# Patient Record
Sex: Female | Born: 1960 | Race: Black or African American | Hispanic: No | Marital: Married | Smoking: Former smoker
Health system: Southern US, Community
[De-identification: ages and names within clinical notes are randomized; demographics above are authoritative.]

## PROBLEM LIST (undated history)

## (undated) DIAGNOSIS — I10 Essential (primary) hypertension: Secondary | ICD-10-CM

## (undated) DIAGNOSIS — N63 Unspecified lump in unspecified breast: Secondary | ICD-10-CM

## (undated) DIAGNOSIS — N6009 Solitary cyst of unspecified breast: Secondary | ICD-10-CM

## (undated) DIAGNOSIS — Z87891 Personal history of nicotine dependence: Secondary | ICD-10-CM

## (undated) DIAGNOSIS — Z1211 Encounter for screening for malignant neoplasm of colon: Secondary | ICD-10-CM

## (undated) DIAGNOSIS — Z1239 Encounter for other screening for malignant neoplasm of breast: Secondary | ICD-10-CM

## (undated) HISTORY — DX: Encounter for other screening for malignant neoplasm of breast: Z12.39

## (undated) HISTORY — PX: BREAST BIOPSY: SHX20

## (undated) HISTORY — DX: Encounter for screening for malignant neoplasm of colon: Z12.11

## (undated) HISTORY — DX: Solitary cyst of unspecified breast: N60.09

## (undated) HISTORY — DX: Personal history of nicotine dependence: Z87.891

## (undated) HISTORY — DX: Essential (primary) hypertension: I10

## (undated) HISTORY — DX: Unspecified lump in unspecified breast: N63.0

## (undated) HISTORY — PX: BREAST SURGERY: SHX581

---

## 2002-07-16 HISTORY — PX: ABDOMINAL HYSTERECTOMY: SHX81

## 2004-06-29 ENCOUNTER — Ambulatory Visit: Payer: Self-pay | Admitting: Family Medicine

## 2005-11-28 ENCOUNTER — Ambulatory Visit: Payer: Self-pay | Admitting: Family Medicine

## 2006-07-16 HISTORY — PX: BREAST SURGERY: SHX581

## 2007-03-21 ENCOUNTER — Ambulatory Visit: Payer: Self-pay | Admitting: Family Medicine

## 2007-03-24 ENCOUNTER — Ambulatory Visit: Payer: Self-pay | Admitting: Family Medicine

## 2007-04-17 ENCOUNTER — Ambulatory Visit: Payer: Self-pay | Admitting: General Surgery

## 2007-04-24 ENCOUNTER — Ambulatory Visit: Payer: Self-pay | Admitting: General Surgery

## 2008-05-06 ENCOUNTER — Ambulatory Visit: Payer: Self-pay | Admitting: Gastroenterology

## 2008-06-16 ENCOUNTER — Ambulatory Visit: Payer: Self-pay | Admitting: Family Medicine

## 2008-06-29 ENCOUNTER — Ambulatory Visit: Payer: Self-pay | Admitting: Family Medicine

## 2008-07-16 DIAGNOSIS — I10 Essential (primary) hypertension: Secondary | ICD-10-CM

## 2008-07-16 HISTORY — DX: Essential (primary) hypertension: I10

## 2008-07-16 HISTORY — PX: COLONOSCOPY: SHX174

## 2009-01-04 ENCOUNTER — Ambulatory Visit: Payer: Self-pay | Admitting: General Surgery

## 2009-09-13 LAB — HM PAP SMEAR: HM Pap smear: NORMAL

## 2009-09-13 LAB — HM DEXA SCAN: HM DEXA SCAN: NORMAL

## 2010-04-25 ENCOUNTER — Ambulatory Visit: Payer: Self-pay | Admitting: Family Medicine

## 2011-05-24 ENCOUNTER — Ambulatory Visit: Payer: Self-pay | Admitting: Family Medicine

## 2011-07-17 DIAGNOSIS — Z87891 Personal history of nicotine dependence: Secondary | ICD-10-CM

## 2011-07-17 HISTORY — DX: Personal history of nicotine dependence: Z87.891

## 2012-05-30 ENCOUNTER — Ambulatory Visit: Payer: Self-pay | Admitting: Family Medicine

## 2012-06-03 ENCOUNTER — Ambulatory Visit: Payer: Self-pay | Admitting: Family Medicine

## 2012-06-11 DIAGNOSIS — N6009 Solitary cyst of unspecified breast: Secondary | ICD-10-CM

## 2012-06-11 HISTORY — DX: Solitary cyst of unspecified breast: N60.09

## 2012-09-27 ENCOUNTER — Encounter: Payer: Self-pay | Admitting: General Surgery

## 2012-10-27 ENCOUNTER — Encounter: Payer: Self-pay | Admitting: *Deleted

## 2012-10-27 DIAGNOSIS — N6009 Solitary cyst of unspecified breast: Secondary | ICD-10-CM | POA: Insufficient documentation

## 2012-10-27 DIAGNOSIS — N63 Unspecified lump in unspecified breast: Secondary | ICD-10-CM | POA: Insufficient documentation

## 2012-12-09 ENCOUNTER — Ambulatory Visit: Payer: PRIVATE HEALTH INSURANCE | Admitting: General Surgery

## 2013-01-15 ENCOUNTER — Encounter: Payer: Self-pay | Admitting: *Deleted

## 2013-01-22 ENCOUNTER — Ambulatory Visit: Payer: Self-pay | Admitting: General Surgery

## 2013-02-11 ENCOUNTER — Ambulatory Visit: Payer: Self-pay | Admitting: General Surgery

## 2013-03-17 ENCOUNTER — Encounter: Payer: Self-pay | Admitting: *Deleted

## 2013-04-24 ENCOUNTER — Encounter: Payer: Self-pay | Admitting: General Surgery

## 2013-05-14 ENCOUNTER — Encounter: Payer: Self-pay | Admitting: General Surgery

## 2013-05-14 ENCOUNTER — Ambulatory Visit (INDEPENDENT_AMBULATORY_CARE_PROVIDER_SITE_OTHER): Payer: PRIVATE HEALTH INSURANCE | Admitting: General Surgery

## 2013-05-14 ENCOUNTER — Other Ambulatory Visit: Payer: PRIVATE HEALTH INSURANCE

## 2013-05-14 VITALS — BP 124/78 | HR 80 | Resp 14 | Ht 66.0 in | Wt 182.0 lb

## 2013-05-14 DIAGNOSIS — N63 Unspecified lump in unspecified breast: Secondary | ICD-10-CM

## 2013-05-14 NOTE — Progress Notes (Signed)
Patient ID: Latoya Jackson, female   DOB: 28-May-1961, 52 y.o.   MRN: [id number]  Chief Complaint  Patient presents with  . Follow-up    breast ultrasound    HPI Latoya Jackson is a 52 y.o. female.  Here for her follow up office breast ultrasound. Denies any new breast complaints.   The patient had undergone aspiration of multiple small cysts in the inferior medial aspect of the left breast last year. HPI  Past Medical History  Diagnosis Date  . Hypertension 2010  . Personal history of tobacco use, presenting hazards to health 2013  . Breast screening, unspecified   . Lump or mass in breast 2008,2009,2103  . Special screening for malignant neoplasms, colon   . Solitary cyst of breast 06/11/2012    left breast 8 o'clock-path, cystic lesionwith small adjacent nodular areas that could be consistent with fat necrosis    Past Surgical History  Procedure Laterality Date  . Abdominal hysterectomy  2004  . Breast surgery Right 2008  . Breast surgery Left 2009,2013  . Colonoscopy  2010    No family history on file.  Social History History  Substance Use Topics  . Smoking status: Current Every Day Smoker -- 0.25 packs/day for 10 years    Types: Cigarettes  . Smokeless tobacco: Not on file     Comment: smokes about 6 cigarettes a day  . Alcohol Use: Yes     Comment: 1-3 drinks per week    No Known Allergies  Current Outpatient Prescriptions  Medication Sig Dispense Refill  . aspirin 81 MG tablet Take 81 mg by mouth daily.      Marland Kitchen lisinopril-hydrochlorothiazide (PRINZIDE,ZESTORETIC) 20-12.5 MG per tablet Take 1 tablet by mouth daily.      Marland Kitchen loratadine (CLARITIN) 10 MG tablet Take 10 mg by mouth daily.        No current facility-administered medications for this visit.    Review of Systems Review of Systems  Constitutional: Negative.   Respiratory: Negative.   Cardiovascular: Negative.     Blood pressure 124/78, pulse 80, resp. rate 14, height 5\' 6"  (1.676 m), weight  182 lb (82.555 kg).  Physical Exam Physical Exam  Constitutional: She is oriented to person, place, and time. She appears well-developed and well-nourished.  Neck: Neck supple.  Cardiovascular: Normal rate, regular rhythm and normal heart sounds.   Pulmonary/Chest: Effort normal and breath sounds normal. Right breast exhibits no inverted nipple, no mass, no nipple discharge, no skin change and no tenderness. Left breast exhibits no inverted nipple, no mass, no nipple discharge, no skin change and no tenderness.  Lymphadenopathy:    She has no cervical adenopathy.    She has no axillary adenopathy.  Neurological: She is alert and oriented to person, place, and time.  Skin: Skin is warm and dry.    Data Reviewed Ultrasound examination of the left breast and it o'clock position 3 cm from the nipple shows a small recurrent hypoechoic nodule measuring less than 0.4 cm in maximum diameter. Previously this multiloculated area had measured up to 0.5 cm in diameter. It had resolved at the time of aspiration at her 06/09/2012 exam. Cytology at that time was listed as inconclusive: Atypical but degenerated cells with apical metaplasia and foam cells. Suggestive of fat necrosis. The cytology report was reviewed with the pathologist.  Assessment    Stable breast exam.    Plan    The patient is due for bilateral mammograms next month. Assuming no interval change,  follow up-year-old be on an as-needed basis.       Earline Mayotte 05/16/2013, 12:10 PM

## 2013-05-14 NOTE — Patient Instructions (Addendum)
Continue self breast exams. Call office for any new breast issues or concerns. Continue mammograms and office visits with primary care Dr Carlynn Purl

## 2013-05-16 ENCOUNTER — Encounter: Payer: Self-pay | Admitting: General Surgery

## 2013-05-18 ENCOUNTER — Telehealth: Payer: Self-pay | Admitting: *Deleted

## 2013-05-18 DIAGNOSIS — Z1231 Encounter for screening mammogram for malignant neoplasm of breast: Secondary | ICD-10-CM

## 2013-05-18 NOTE — Telephone Encounter (Signed)
Message copied by Nicholes Mango on Mon May 18, 2013  2:58 PM ------      Message from: Earline Mayotte      Created: Sat May 16, 2013 12:21 PM       Please confirm the patient is a mammogram scheduled for this months. Last study 05/30/2012. If not please arrange for a screening mammogram. If she has her mammograms already ordered by Dr. Sallee Lange, ask her to notify us when they're done so the films can be reviewed. Thank you ------

## 2013-05-18 NOTE — Telephone Encounter (Signed)
Patient was contacted and states that Dr. Carlynn Purl has not ordered her mammogram yet and she is not due to follow up with her until January 2015. This patient has been scheduled for a screening mammogram at [facility] for 06-04-13 at 4 pm ([place]-per patient's request). She is aware of date, time, and instructions. Patient to call the office the day after mammogram for results (she is aware that Dr. Lemar Livings will be on vacation the last week in November and he may not review until his return).

## 2013-05-22 ENCOUNTER — Encounter: Payer: Self-pay | Admitting: General Surgery

## 2013-05-25 NOTE — Telephone Encounter (Signed)
Mammo 06-04-13

## 2013-06-04 ENCOUNTER — Ambulatory Visit: Payer: Self-pay | Admitting: General Surgery

## 2013-06-04 LAB — HM MAMMOGRAPHY: HM Mammogram: NORMAL

## 2013-11-18 LAB — LIPID PANEL
CHOLESTEROL: 220 mg/dL — AB (ref 0–200)
HDL: 161 mg/dL — AB (ref 35–70)
LDL Cholesterol: 143 mg/dL
Triglycerides: 79 mg/dL (ref 40–160)

## 2013-11-18 LAB — HEMOGLOBIN A1C: Hgb A1c MFr Bld: 5.9 % (ref 4.0–6.0)

## 2014-02-15 ENCOUNTER — Ambulatory Visit: Payer: Self-pay | Admitting: Gastroenterology

## 2014-02-15 LAB — HM COLONOSCOPY

## 2014-05-17 ENCOUNTER — Encounter: Payer: Self-pay | Admitting: General Surgery

## 2014-06-29 ENCOUNTER — Ambulatory Visit: Payer: Self-pay | Admitting: Family Medicine

## 2014-12-31 ENCOUNTER — Ambulatory Visit: Payer: Self-pay | Admitting: Family Medicine

## 2015-01-11 ENCOUNTER — Ambulatory Visit: Payer: Self-pay | Admitting: Family Medicine

## 2015-01-19 ENCOUNTER — Encounter: Payer: Self-pay | Admitting: Family Medicine

## 2015-01-19 DIAGNOSIS — J3089 Other allergic rhinitis: Secondary | ICD-10-CM | POA: Insufficient documentation

## 2015-01-19 DIAGNOSIS — K219 Gastro-esophageal reflux disease without esophagitis: Secondary | ICD-10-CM | POA: Insufficient documentation

## 2015-01-19 DIAGNOSIS — R7401 Elevation of levels of liver transaminase levels: Secondary | ICD-10-CM | POA: Insufficient documentation

## 2015-01-19 DIAGNOSIS — F4321 Adjustment disorder with depressed mood: Secondary | ICD-10-CM | POA: Insufficient documentation

## 2015-01-19 DIAGNOSIS — L509 Urticaria, unspecified: Secondary | ICD-10-CM | POA: Insufficient documentation

## 2015-01-19 DIAGNOSIS — R739 Hyperglycemia, unspecified: Secondary | ICD-10-CM | POA: Insufficient documentation

## 2015-01-19 DIAGNOSIS — Z8619 Personal history of other infectious and parasitic diseases: Secondary | ICD-10-CM | POA: Insufficient documentation

## 2015-01-19 DIAGNOSIS — E785 Hyperlipidemia, unspecified: Secondary | ICD-10-CM | POA: Insufficient documentation

## 2015-01-19 DIAGNOSIS — Z720 Tobacco use: Secondary | ICD-10-CM | POA: Insufficient documentation

## 2015-01-19 DIAGNOSIS — R740 Nonspecific elevation of levels of transaminase and lactic acid dehydrogenase [LDH]: Secondary | ICD-10-CM

## 2015-01-19 DIAGNOSIS — E538 Deficiency of other specified B group vitamins: Secondary | ICD-10-CM | POA: Insufficient documentation

## 2015-01-19 DIAGNOSIS — E559 Vitamin D deficiency, unspecified: Secondary | ICD-10-CM | POA: Insufficient documentation

## 2015-01-19 DIAGNOSIS — L821 Other seborrheic keratosis: Secondary | ICD-10-CM | POA: Insufficient documentation

## 2015-01-19 DIAGNOSIS — D573 Sickle-cell trait: Secondary | ICD-10-CM | POA: Insufficient documentation

## 2015-01-19 DIAGNOSIS — I10 Essential (primary) hypertension: Secondary | ICD-10-CM | POA: Insufficient documentation

## 2015-01-20 ENCOUNTER — Ambulatory Visit: Payer: Self-pay | Admitting: Family Medicine

## 2015-01-23 ENCOUNTER — Other Ambulatory Visit: Payer: Self-pay | Admitting: Family Medicine

## 2015-02-02 ENCOUNTER — Ambulatory Visit (INDEPENDENT_AMBULATORY_CARE_PROVIDER_SITE_OTHER): Payer: BLUE CROSS/BLUE SHIELD | Admitting: Family Medicine

## 2015-02-02 ENCOUNTER — Encounter: Payer: Self-pay | Admitting: Family Medicine

## 2015-02-02 ENCOUNTER — Encounter (INDEPENDENT_AMBULATORY_CARE_PROVIDER_SITE_OTHER): Payer: Self-pay

## 2015-02-02 VITALS — BP 136/84 | HR 80 | Temp 98.3°F | Resp 18 | Ht 66.0 in | Wt 191.9 lb

## 2015-02-02 DIAGNOSIS — E559 Vitamin D deficiency, unspecified: Secondary | ICD-10-CM

## 2015-02-02 DIAGNOSIS — E66811 Obesity, class 1: Secondary | ICD-10-CM | POA: Insufficient documentation

## 2015-02-02 DIAGNOSIS — E785 Hyperlipidemia, unspecified: Secondary | ICD-10-CM

## 2015-02-02 DIAGNOSIS — E538 Deficiency of other specified B group vitamins: Secondary | ICD-10-CM | POA: Diagnosis not present

## 2015-02-02 DIAGNOSIS — I10 Essential (primary) hypertension: Secondary | ICD-10-CM

## 2015-02-02 DIAGNOSIS — K219 Gastro-esophageal reflux disease without esophagitis: Secondary | ICD-10-CM

## 2015-02-02 DIAGNOSIS — R739 Hyperglycemia, unspecified: Secondary | ICD-10-CM

## 2015-02-02 DIAGNOSIS — J309 Allergic rhinitis, unspecified: Secondary | ICD-10-CM | POA: Diagnosis not present

## 2015-02-02 DIAGNOSIS — J3089 Other allergic rhinitis: Secondary | ICD-10-CM

## 2015-02-02 DIAGNOSIS — E669 Obesity, unspecified: Secondary | ICD-10-CM | POA: Diagnosis not present

## 2015-02-02 DIAGNOSIS — R5383 Other fatigue: Secondary | ICD-10-CM

## 2015-02-02 MED ORDER — LISINOPRIL-HYDROCHLOROTHIAZIDE 20-12.5 MG PO TABS: 1.0000 | ORAL_TABLET | Freq: Every morning | ORAL | Status: DC

## 2015-02-02 MED ORDER — LORATADINE 10 MG PO TABS: 10.0000 mg | ORAL_TABLET | Freq: Every day | ORAL | Status: DC

## 2015-02-02 MED ORDER — B-12 1000 MCG SL SUBL: 1.0000 | SUBLINGUAL_TABLET | Freq: Every day | SUBLINGUAL | Status: AC

## 2015-02-02 NOTE — Patient Instructions (Signed)

## 2015-02-02 NOTE — Progress Notes (Signed)
Name: Latoya Jackson   MRN: [id number]    DOB: [birth year]   Date:02/02/2015       Progress Note  Subjective  Chief Complaint  Chief Complaint  Patient presents with  . Medication Refill    6 month F/U  . Gastrophageal Reflux    worsen-trying to watch what she eats, been eating candy and chocolate.  . Hypertension    Checks at home BP- 120/80's  . Hyperlipidemia    no problems    HPI  HTN: she has not taken bp medication past couple days, bp is up today, she denies side effects of medication. She denies chest pain or SOB  GERD: she has not been following a healthy diet lately, gained weight , eating later than usual. Symptoms of a few days weekly , resolves with Tums.    Hyperlipidemia: not currently on medication, due for repeat labs  Obesity: she has gained weight since last visit, not as compliant with a healthy diet, granddaughter has been at her house for the past 2 months and she has been eating later, around 8:30 pm, and having snacks.  Not exercise at all.  Fatigue: feeling more tired than usual lately  Perennial AR: doing well on Loratadine daily, no sneezing, nasal congestion or watery eyes at this time   Patient Active Problem List   Diagnosis Date Noted  . Obesity (BMI 30.0-34.9) 02/02/2015  . Benign essential HTN 01/19/2015  . Dyslipidemia 01/19/2015  . 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 ALT (SGPT) level raised 01/19/2015  . Gastro-esophageal reflux disease without esophagitis 01/19/2015  . Hive 01/19/2015  . Blood glucose elevated 01/19/2015  . B12 deficiency 01/19/2015  . Perennial allergic rhinitis 01/19/2015  . History of Helicobacter pylori infection 01/19/2015  . 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 AS (sickle cell trait) 01/19/2015  . Tobacco use 01/19/2015  . Vitamin D deficiency 01/19/2015  . Solitary cyst of breast     Past Surgical History  Procedure Laterality Date  . Abdominal hysterectomy  2004  . Colonoscopy  2010  . Breast surgery  Right 2008    Intraductal papilloma excised  . Breast surgery Left 2009,2013    Benign breast tissue with dense fibrosis., Focal adenosis with a few small intraductal microcalcifications.    History reviewed. No pertinent family history.  History   Social History  . Marital Status: Married    Spouse Name: N/A  . Number of Children: N/A  . Years of Education: N/A   Occupational History  . Not on file.   Social History Main Topics  . Smoking status: Current Every Day Smoker -- 0.15 packs/day for 10 years    Types: Cigarettes    Start date: 02/01/2005  . Smokeless tobacco: Never Used     Comment: smokes about 6 cigarettes a day  . Alcohol Use: 0.6 - 1.8 oz/week    1-3 Standard drinks or equivalent per week     Comment: 1-3 drinks per week  . Drug Use: No  . Sexual Activity:    Partners: Male   Other Topics Concern  . Not on file   Social History Narrative     Current outpatient prescriptions:  .  aspirin 81 MG tablet, Take 81 mg by mouth daily., Disp: , Rfl:  .  Cholecalciferol (VITAMIN D) 2000 UNITS CAPS, Take 1 capsule by mouth daily., Disp: , Rfl:  .  Cyanocobalamin (B-12) 1000 MCG SUBL, Place 1 tablet under the tongue daily., Disp: 90 each, Rfl: 1 .  lisinopril-hydrochlorothiazide (PRINZIDE,ZESTORETIC) 20-12.5 MG per tablet, Take 1 tablet by mouth every morning., Disp: 90 tablet, Rfl: 1 .  loratadine (CLARITIN) 10 MG tablet, Take 1 tablet (10 mg total) by mouth daily., Disp: 90 tablet, Rfl: 1  Allergies  Allergen Reactions  . Pollen Extract   . Tree Extract      ROS  Constitutional: Negative for fever positive for  weight change.  Respiratory: Negative for cough and shortness of breath.   Cardiovascular: Negative for chest pain or palpitations.  Gastrointestinal: Negative for abdominal pain, no bowel changes.  Musculoskeletal: Negative for gait problem or joint swelling.  Skin: Negative for rash.  Neurological: Negative for dizziness or headache.  No  other specific complaints in a complete review of systems (except as listed in HPI above).  Objective  Filed Vitals:   02/02/15 0820  BP: 144/88  Pulse: 80  Temp: 98.3 F (36.8 C)  TempSrc: Oral  Resp: 18  Height: 5\' 6"  (1.676 m)  Weight: 191 lb 14.4 oz (87.045 kg)  SpO2: 98%    Body mass index is 30.99 kg/(m^2).  Physical Exam  Constitutional: Patient appears well-developed and well-nourished. Obese No distress.  Eyes:  No scleral icterus. PERL Neck: Normal range of motion. Neck supple. Cardiovascular: Normal rate, regular rhythm and normal heart sounds.  No murmur heard. No BLE edema. Pulmonary/Chest: Effort normal and breath sounds normal. No respiratory distress. Abdominal: Soft.  There is no tenderness. Psychiatric: Patient has a normal mood and affect. behavior is normal. Judgment and thought content normal.  PHQ2/9: Depression screen PHQ 2/9 02/02/2015  Decreased Interest 0  Down, Depressed, Hopeless 0  PHQ - 2 Score 0    Fall Risk: Fall Risk  02/02/2015  Falls in the past year? No      Assessment & Plan  1. Benign essential HTN  Resume medication  - Comprehensive Metabolic Panel (CMET) - lisinopril-hydrochlorothiazide (PRINZIDE,ZESTORETIC) 20-12.5 MG per tablet; Take 1 tablet by mouth every morning.  Dispense: 90 tablet; Refill: 1  2. Vitamin D deficiency  - Vitamin D (25 hydroxy)  3. Blood glucose elevated  - HgB A1c  4. B12 deficiency  - Cyanocobalamin (B-12) 1000 MCG SUBL; Place 1 tablet under the tongue daily.  Dispense: 90 each; Refill: 1  5. Gastro-esophageal reflux disease without esophagitis  Resume life style modification , and can take Zantac or Tums prn   6. Dyslipidemia  - Lipid Profile  7. Other fatigue  - CBC with Differential  8. Perennial allergic rhinitis  - loratadine (CLARITIN) 10 MG tablet; Take 1 tablet (10 mg total) by mouth daily.  Dispense: 90 tablet; Refill: 1  9. Obesity (BMI 30.0-34.9) Discussed portion  size, eat earlier in the evening. Pack a lighter lunch, and start exercising.

## 2015-02-03 ENCOUNTER — Other Ambulatory Visit: Payer: Self-pay | Admitting: Family Medicine

## 2015-02-03 LAB — COMPREHENSIVE METABOLIC PANEL
A/G RATIO: 1.7 (ref 1.1–2.5)
ALBUMIN: 4.3 g/dL (ref 3.5–5.5)
ALK PHOS: 56 IU/L (ref 39–117)
ALT: 29 IU/L (ref 0–32)
AST: 24 IU/L (ref 0–40)
BUN/Creatinine Ratio: 18 (ref 9–23)
BUN: 13 mg/dL (ref 6–24)
Bilirubin Total: <0.2 mg/dL (ref 0.0–1.2)
CALCIUM: 9.8 mg/dL (ref 8.7–10.2)
CHLORIDE: 102 mmol/L (ref 97–108)
CO2: 23 mmol/L (ref 18–29)
Creatinine, Ser: 0.73 mg/dL (ref 0.57–1.00)
GFR calc Af Amer: 106 mL/min/{1.73_m2} (ref >59)
GFR calc non Af Amer: 92 mL/min/{1.73_m2} (ref >59)
GLOBULIN, TOTAL: 2.6 g/dL (ref 1.5–4.5)
Glucose: 95 mg/dL (ref 65–99)
POTASSIUM: 4.7 mmol/L (ref 3.5–5.2)
SODIUM: 141 mmol/L (ref 134–144)
Total Protein: 6.9 g/dL (ref 6.0–8.5)

## 2015-02-03 LAB — CBC WITH DIFFERENTIAL/PLATELET
BASOS ABS: 0 10*3/uL (ref 0.0–0.2)
Basos: 0 %
EOS (ABSOLUTE): 0.2 10*3/uL (ref 0.0–0.4)
EOS: 2 %
Hematocrit: 36.7 % (ref 34.0–46.6)
Hemoglobin: 12.4 g/dL (ref 11.1–15.9)
Immature Grans (Abs): 0 10*3/uL (ref 0.0–0.1)
Immature Granulocytes: 0 %
LYMPHS: 32 %
Lymphocytes Absolute: 2.2 10*3/uL (ref 0.7–3.1)
MCH: 31.2 pg (ref 26.6–33.0)
MCHC: 33.8 g/dL (ref 31.5–35.7)
MCV: 92 fL (ref 79–97)
Monocytes Absolute: 0.6 10*3/uL (ref 0.1–0.9)
Monocytes: 9 %
NEUTROS ABS: 3.9 10*3/uL (ref 1.4–7.0)
NEUTROS PCT: 57 %
Platelets: 348 10*3/uL (ref 150–379)
RBC: 3.98 x10E6/uL (ref 3.77–5.28)
RDW: 13.5 % (ref 12.3–15.4)
WBC: 6.8 10*3/uL (ref 3.4–10.8)

## 2015-02-03 LAB — LIPID PANEL
CHOL/HDL RATIO: 4 ratio (ref 0.0–4.4)
CHOLESTEROL TOTAL: 223 mg/dL — AB (ref 100–199)
HDL: 56 mg/dL (ref >39)
LDL Calculated: 149 mg/dL — ABNORMAL HIGH (ref 0–99)
Triglycerides: 89 mg/dL (ref 0–149)
VLDL Cholesterol Cal: 18 mg/dL (ref 5–40)

## 2015-02-03 LAB — VITAMIN D 25 HYDROXY (VIT D DEFICIENCY, FRACTURES): Vit D, 25-Hydroxy: 42.8 ng/mL (ref 30.0–100.0)

## 2015-02-03 LAB — HEMOGLOBIN A1C
Est. average glucose Bld gHb Est-mCnc: 123 mg/dL
Hgb A1c MFr Bld: 5.9 % — ABNORMAL HIGH (ref 4.8–5.6)

## 2015-02-03 MED ORDER — ATORVASTATIN CALCIUM 40 MG PO TABS: 40.0000 mg | ORAL_TABLET | Freq: Every day | ORAL | Status: DC

## 2015-05-05 ENCOUNTER — Encounter: Payer: Self-pay | Admitting: Family Medicine

## 2015-05-05 ENCOUNTER — Ambulatory Visit (INDEPENDENT_AMBULATORY_CARE_PROVIDER_SITE_OTHER): Payer: BLUE CROSS/BLUE SHIELD | Admitting: Family Medicine

## 2015-05-05 VITALS — BP 122/66 | HR 93 | Temp 98.1°F | Resp 16 | Ht 66.0 in | Wt 187.1 lb

## 2015-05-05 DIAGNOSIS — I10 Essential (primary) hypertension: Secondary | ICD-10-CM

## 2015-05-05 DIAGNOSIS — M62838 Other muscle spasm: Secondary | ICD-10-CM

## 2015-05-05 DIAGNOSIS — Z23 Encounter for immunization: Secondary | ICD-10-CM | POA: Diagnosis not present

## 2015-05-05 DIAGNOSIS — E785 Hyperlipidemia, unspecified: Secondary | ICD-10-CM | POA: Diagnosis not present

## 2015-05-05 LAB — POCT URINALYSIS DIPSTICK
Bilirubin, UA: NEGATIVE
Glucose, UA: NEGATIVE
Ketones, UA: NEGATIVE
LEUKOCYTES UA: NEGATIVE
NITRITE UA: NEGATIVE
PH UA: 5
PROTEIN UA: NEGATIVE
RBC UA: NEGATIVE
Spec Grav, UA: 1.015
UROBILINOGEN UA: 0.2

## 2015-05-05 MED ORDER — NAPROXEN 500 MG PO TABS: 500.0000 mg | ORAL_TABLET | Freq: Two times a day (BID) | ORAL | Status: DC

## 2015-05-05 MED ORDER — METAXALONE 800 MG PO TABS: 800.0000 mg | ORAL_TABLET | Freq: Three times a day (TID) | ORAL | Status: DC

## 2015-05-05 NOTE — Progress Notes (Signed)
Name: Latoya MartinezSabrina H Jackson   MRN: [id number]    DOB: Nov 18, 1960   Date:05/05/2015       Progress Note  Subjective  Chief Complaint  Chief Complaint  Patient presents with  . Back Pain    onset yesterday started on right side and now has moved to lower back.  Patient states worse when she stands.  Denies any trauma or burning or pain with urination.    HPI   Flank pain: she got up feeling well yesterday, but as she was walking out of her house, she developed acute pain on right flank area. Pain constant, but worse with movement, better when holding the are.  No fever, no urinary symptoms ( frequency, blood in urine, dysuria), no back pain, no radiation to lower leg or inner abdomen. Pain is described as a sharp sensation, at rest is 5/10, but gets to 10/10 with movement. No rashes on the area.   Hyperlipidemia: taking Atorvastatin since July. ASCVD : 16% in the next 10 years. No side effects. No chest pain, no myalgias.   HTN: taking medication, feeling well, bp is at goal, no edema, no SOB   Patient Active Problem List   Diagnosis Date Noted  . Obesity (BMI 30.0-34.9) 02/02/2015  . Benign essential HTN 01/19/2015  . Dyslipidemia 01/19/2015  . 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 Gastro-esophageal reflux disease without esophagitis 01/19/2015  . Hive 01/19/2015  . Blood glucose elevated 01/19/2015  . B12 deficiency 01/19/2015  . Perennial allergic rhinitis 01/19/2015  . History of Helicobacter pylori infection 01/19/2015  . 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 AS (sickle cell trait) (HCC) 01/19/2015  . Tobacco use 01/19/2015  . Vitamin D deficiency 01/19/2015  . Solitary cyst of breast     Past Surgical History  Procedure Laterality Date  . Abdominal hysterectomy  2004  . Colonoscopy  2010  . Breast surgery Right 2008    Intraductal papilloma excised  . Breast surgery Left 2009,2013    Benign breast tissue with dense fibrosis., Focal adenosis with a few small intraductal  microcalcifications.    No family history on file.  Social History   Social History  . Marital Status: Married    Spouse Name: N/A  . Number of Children: N/A  . Years of Education: N/A   Occupational History  . Not on file.   Social History Main Topics  . Smoking status: Current Every Day Smoker -- 0.15 packs/day for 10 years    Types: Cigarettes    Start date: 02/01/2005  . Smokeless tobacco: Never Used     Comment: smokes about 6 cigarettes a day  . Alcohol Use: 0.6 - 1.8 oz/week    1-3 Standard drinks or equivalent per week     Comment: 1-3 drinks per week  . Drug Use: No  . Sexual Activity:    Partners: Male   Other Topics Concern  . Not on file   Social History Narrative     Current outpatient prescriptions:  .  aspirin 81 MG tablet, Take 81 mg by mouth daily., Disp: , Rfl:  .  atorvastatin (LIPITOR) 40 MG tablet, Take 1 tablet (40 mg total) by mouth daily., Disp: 90 tablet, Rfl: 1 .  Cholecalciferol (VITAMIN D) 2000 UNITS CAPS, Take 1 capsule by mouth daily., Disp: , Rfl:  .  Cyanocobalamin (B-12) 1000 MCG SUBL, Place 1 tablet under the tongue daily., Disp: 90 each, Rfl: 1 .  lisinopril-hydrochlorothiazide (PRINZIDE,ZESTORETIC) 20-12.5 MG per tablet, Take 1 tablet  by mouth every morning., Disp: 90 tablet, Rfl: 1 .  loratadine (CLARITIN) 10 MG tablet, Take 1 tablet (10 mg total) by mouth daily., Disp: 90 tablet, Rfl: 1 .  metaxalone (SKELAXIN) 800 MG tablet, Take 1 tablet (800 mg total) by mouth 3 (three) times daily., Disp: 30 tablet, Rfl: 0 .  naproxen (NAPROSYN) 500 MG tablet, Take 1 tablet (500 mg total) by mouth 2 (two) times daily with a meal., Disp: 30 tablet, Rfl: 0  Allergies  Allergen Reactions  . Pollen Extract   . Tree Extract      ROS   Constitutional: Negative for fever or weight change.  Respiratory: Negative for cough and shortness of breath.   Cardiovascular: Negative for chest pain or palpitations.  Gastrointestinal: Positive  for  abdominal wall  pain, no bowel changes.  Musculoskeletal: Negative for gait problem or joint swelling.  Skin: Negative for rash.  Neurological: Negative for dizziness or headache.  No other specific complaints in a complete review of systems (except as listed in HPI above).  Objective  Filed Vitals:   05/05/15 1501  BP: 122/66  Pulse: 93  Temp: 98.1 F (36.7 C)  TempSrc: Oral  Resp: 16  Height:  (1.676 m)  Weight: 187 lb 1.6 oz (84.868 kg)  SpO2: 95%    Body mass index is 30.21 kg/(m^2).  Physical Exam  Constitutional: Patient appears well-developed and well-nourished. Obese  No distress.  HEENT: head atraumatic, normocephalic, pupils equal and reactive to light,  neck supple, throat within normal limits Cardiovascular: Normal rate, regular rhythm and normal heart sounds.  No murmur heard. No BLE edema. Pulmonary/Chest: Effort normal and breath sounds normal. No respiratory distress. Abdominal: Soft.  Negative CVA, tender during palpation of right flank, and also with rotation of trunk to the left side, no rashes, no abdominal masses, pain is reproducible Psychiatric: Patient has a normal mood and affect. behavior is normal. Judgment and thought content normal.  Recent Results (from the past 2160 hour(s))  POCT urinalysis dipstick     Status: Normal   Collection Time: 05/05/15  3:07 PM  Result Value Ref Range   Color, UA yellow    Clarity, UA clear    Glucose, UA neg    Bilirubin, UA neg    Ketones, UA neg    Spec Grav, UA 1.015    Blood, UA neg    pH, UA 5.0    Protein, UA neg    Urobilinogen, UA 0.2    Nitrite, UA neg    Leukocytes, UA Negative Negative     PHQ2/9: Depression screen [facility] 2/9 05/05/2015 02/02/2015  Decreased Interest 0 0  Down, Depressed, Hopeless 0 0  PHQ - 2 Score 0 0     Fall Risk: Fall Risk  05/05/2015 02/02/2015  Falls in the past year? No No     Functional Status Survey: Is the patient deaf or have difficulty hearing?:  No Does the patient have difficulty seeing, even when wearing glasses/contacts?: Yes (contacts) Does the patient have difficulty concentrating, remembering, or making decisions?: No Does the patient have difficulty walking or climbing stairs?: No Does the patient have difficulty dressing or bathing?: No Does the patient have difficulty doing errands alone such as visiting a doctor's office or shopping?: No    Assessment & Plan  1. Spasm of abdominal muscles of right side  Flank pain, seems muscular, we will treat with Skelaxin and Naproxen, heating pad and if no resolution return for trigger point  injection - POCT urinalysis dipstick - metaxalone (SKELAXIN) 800 MG tablet; Take 1 tablet (800 mg total) by mouth 3 (three) times daily.  Dispense: 30 tablet; Refill: 0 - naproxen (NAPROSYN) 500 MG tablet; Take 1 tablet (500 mg total) by mouth 2 (two) times daily with a meal.  Dispense: 30 tablet; Refill: 0  2. Needs flu shot  - Flu Vaccine QUAD 36+ mos PF IM (Fluarix & Fluzone Quad PF) - she will get it at work next week and will send a copy for our records  3. Benign essential HTN   at goal - Comprehensive metabolic panel  4. Dyslipidemia  Recheck labs, taking statin therapy for the past few months - Lipid panel

## 2015-05-22 ENCOUNTER — Other Ambulatory Visit: Payer: Self-pay | Admitting: Family Medicine

## 2015-08-20 ENCOUNTER — Other Ambulatory Visit: Payer: Self-pay | Admitting: Family Medicine

## 2015-08-25 ENCOUNTER — Other Ambulatory Visit: Payer: Self-pay | Admitting: Family Medicine

## 2015-08-25 DIAGNOSIS — I10 Essential (primary) hypertension: Secondary | ICD-10-CM

## 2015-08-25 MED ORDER — LISINOPRIL-HYDROCHLOROTHIAZIDE 20-12.5 MG PO TABS: 1.0000 | ORAL_TABLET | Freq: Every morning | ORAL | Status: DC

## 2015-08-25 MED ORDER — ATORVASTATIN CALCIUM 40 MG PO TABS: 40.0000 mg | ORAL_TABLET | Freq: Every day | ORAL | Status: DC

## 2015-08-25 MED ORDER — LORATADINE 10 MG PO TABS: ORAL_TABLET | ORAL | Status: DC

## 2015-08-25 NOTE — Telephone Encounter (Signed)
Pt needs reifll on Lisinopril, Loratidine, and cholesterol to be sent to [facility] in [place]

## 2015-08-25 NOTE — Telephone Encounter (Signed)
I am forwarding this encounter to the designated PCP and/or their nursing staff for further management of the tasks requested. Thank you.  

## 2015-08-26 ENCOUNTER — Other Ambulatory Visit: Payer: Self-pay | Admitting: Family Medicine

## 2015-08-26 MED ORDER — LORATADINE 10 MG PO TABS: 10.0000 mg | ORAL_TABLET | Freq: Two times a day (BID) | ORAL | Status: DC

## 2015-08-26 NOTE — Telephone Encounter (Signed)
Appointment made and patient informed. Please see additional note. Patient is requesting that loratadine be changed from once daily to twice daily.

## 2015-08-26 NOTE — Telephone Encounter (Signed)
Refill request was sent to Dr. Krichna Sowles for approval and submission.  

## 2015-08-26 NOTE — Telephone Encounter (Signed)
Patient is requesting that you change the loratadine from once daily to 2 times per day. Please send to [email or web address]

## 2015-11-22 ENCOUNTER — Ambulatory Visit: Payer: BLUE CROSS/BLUE SHIELD | Admitting: Family Medicine

## 2015-12-20 ENCOUNTER — Ambulatory Visit (INDEPENDENT_AMBULATORY_CARE_PROVIDER_SITE_OTHER): Payer: BLUE CROSS/BLUE SHIELD | Admitting: Family Medicine

## 2015-12-20 ENCOUNTER — Encounter: Payer: Self-pay | Admitting: Family Medicine

## 2015-12-20 VITALS — BP 138/74 | HR 76 | Temp 98.3°F | Resp 16 | Ht 66.0 in | Wt 191.2 lb

## 2015-12-20 DIAGNOSIS — E785 Hyperlipidemia, unspecified: Secondary | ICD-10-CM | POA: Diagnosis not present

## 2015-12-20 DIAGNOSIS — Z1231 Encounter for screening mammogram for malignant neoplasm of breast: Secondary | ICD-10-CM

## 2015-12-20 DIAGNOSIS — T148 Other injury of unspecified body region: Secondary | ICD-10-CM | POA: Diagnosis not present

## 2015-12-20 DIAGNOSIS — K219 Gastro-esophageal reflux disease without esophagitis: Secondary | ICD-10-CM | POA: Diagnosis not present

## 2015-12-20 DIAGNOSIS — J309 Allergic rhinitis, unspecified: Secondary | ICD-10-CM | POA: Diagnosis not present

## 2015-12-20 DIAGNOSIS — R7303 Prediabetes: Secondary | ICD-10-CM

## 2015-12-20 DIAGNOSIS — W57XXXA Bitten or stung by nonvenomous insect and other nonvenomous arthropods, initial encounter: Secondary | ICD-10-CM | POA: Diagnosis not present

## 2015-12-20 DIAGNOSIS — E669 Obesity, unspecified: Secondary | ICD-10-CM

## 2015-12-20 DIAGNOSIS — I10 Essential (primary) hypertension: Secondary | ICD-10-CM | POA: Diagnosis not present

## 2015-12-20 DIAGNOSIS — J3089 Other allergic rhinitis: Secondary | ICD-10-CM

## 2015-12-20 MED ORDER — SULFAMETHOXAZOLE-TRIMETHOPRIM 800-160 MG PO TABS: 1.0000 | ORAL_TABLET | Freq: Two times a day (BID) | ORAL | Status: DC

## 2015-12-20 MED ORDER — ATORVASTATIN CALCIUM 40 MG PO TABS: 40.0000 mg | ORAL_TABLET | Freq: Every day | ORAL | Status: DC

## 2015-12-20 MED ORDER — LISINOPRIL-HYDROCHLOROTHIAZIDE 20-12.5 MG PO TABS: 1.0000 | ORAL_TABLET | Freq: Every morning | ORAL | Status: DC

## 2015-12-20 NOTE — Progress Notes (Signed)
Name: Latoya Jackson   MRN: [id number]    DOB: 1961-06-16   Date:12/20/2015       Progress Note  Subjective  Chief Complaint  Chief Complaint  Patient presents with  . Medication Refill    6 month F/U  . Hypertension  . Hyperlipidemia  . Allergic Rhinitis     Well controlled   . Leg Pain    Onset-yesterday-right Calf and back of thigh, red swollen and patient states there is two hard knots on her legs that is suddenly appeared-patient has been putting alcohol on them.     HPI  Insect bite: she was at work yesterday and felt a few bumps on her leg that was very itchy afterwards, last night she noticed an area of erythema and induration around each bite. Not using any medication, only topical alcohol.   Hyperlipidemia: taking Atorvastatin since July. ASCVD : 16% in the next 10 years. No side effects. No chest pain, no myalgias. She is compliant with medication and we will recheck labs before her next visit  HTN: taking medication, feeling well, bp is at goal, no edema, no SOB  GERD: symptoms heart burn is intermittent and takes prn medication, like Tums prn. No weight loss, no regurgitation  AR: taking Loratadine daily and symptoms are under control, no rhinorrhea or nasal congestion    Patient Active Problem List   Diagnosis Date Noted  . Obesity (BMI 30.0-34.9) 02/02/2015  . Benign essential HTN 01/19/2015  . Dyslipidemia 01/19/2015  . 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 Gastro-esophageal reflux disease without esophagitis 01/19/2015  . Hive 01/19/2015  . Blood glucose elevated 01/19/2015  . B12 deficiency 01/19/2015  . Perennial allergic rhinitis 01/19/2015  . History of Helicobacter pylori infection 01/19/2015  . 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 AS (sickle cell trait) (HCC) 01/19/2015  . Tobacco use 01/19/2015  . Vitamin D deficiency 01/19/2015  . Solitary cyst of breast     Past Surgical History  Procedure Laterality Date  . Abdominal hysterectomy  2004  .  Colonoscopy  2010  . Breast surgery Right 2008    Intraductal papilloma excised  . Breast surgery Left 2009,2013    Benign breast tissue with dense fibrosis., Focal adenosis with a few small intraductal microcalcifications.    History reviewed. No pertinent family history.  Social History   Social History  . Marital Status: Married    Spouse Name: N/A  . Number of Children: N/A  . Years of Education: N/A   Occupational History  . Not on file.   Social History Main Topics  . Smoking status: Current Every Day Smoker -- 0.15 packs/day for 10 years    Types: Cigarettes    Start date: 02/01/2005  . Smokeless tobacco: Never Used     Comment: smokes about 6 cigarettes a day  . Alcohol Use: 0.6 - 1.8 oz/week    1-3 Standard drinks or equivalent per week     Comment: 1-3 drinks per week  . Drug Use: No  . Sexual Activity:    Partners: Male   Other Topics Concern  . Not on file   Social History Narrative     Current outpatient prescriptions:  .  aspirin 81 MG tablet, Take 81 mg by mouth daily., Disp: , Rfl:  .  atorvastatin (LIPITOR) 40 MG tablet, Take 1 tablet (40 mg total) by mouth daily., Disp: 90 tablet, Rfl: 1 .  Cholecalciferol (VITAMIN D) 2000 UNITS CAPS, Take 1 capsule by mouth daily.,  Disp: , Rfl:  .  Cyanocobalamin (B-12) 1000 MCG SUBL, Place 1 tablet under the tongue daily., Disp: 90 each, Rfl: 1 .  lisinopril-hydrochlorothiazide (PRINZIDE,ZESTORETIC) 20-12.5 MG tablet, Take 1 tablet by mouth every morning., Disp: 90 tablet, Rfl: 1 .  loratadine (CLARITIN) 10 MG tablet, Take 1 tablet (10 mg total) by mouth 2 (two) times daily. TAKE 1 TABLET (10 MG TOTAL) BY MOUTH DAILY., Disp: 90 tablet, Rfl: 1 .  sulfamethoxazole-trimethoprim (BACTRIM DS,SEPTRA DS) 800-160 MG tablet, Take 1 tablet by mouth 2 (two) times daily., Disp: 14 tablet, Rfl: 0  Allergies  Allergen Reactions  . Pollen Extract   . Tree Extract      ROS  Constitutional: Negative for fever or  significant  weight change.  Respiratory: Negative for cough and shortness of breath.   Cardiovascular: Negative for chest pain or palpitations.  Gastrointestinal: Negative for abdominal pain, no bowel changes.  Musculoskeletal: Negative for gait problem or joint swelling.  Skin:  Positive for rash.  Neurological: Negative for dizziness or headache.  No other specific complaints in a complete review of systems (except as listed in HPI above).  Objective  Filed Vitals:   12/20/15 1519  BP: 138/74  Pulse: 76  Temp: 98.3 F (36.8 C)  TempSrc: Oral  Resp: 16  Height: 5\' 6"  (1.676 m)  Weight: 191 lb 3.2 oz (86.728 kg)  SpO2: 97%    Body mass index is 30.88 kg/(m^2).  Physical Exam  Constitutional: Patient appears well-developed and well-nourished. Obese  No distress.  HEENT: head atraumatic, normocephalic, pupils equal and reactive to light,neck supple, throat within normal limits Cardiovascular: Normal rate, regular rhythm and normal heart sounds.  No murmur heard. No BLE edema. Pulmonary/Chest: Effort normal and breath sounds normal. No respiratory distress. Abdominal: Soft.  There is no tenderness. Psychiatric: Patient has a normal mood and affect. behavior is normal. Judgment and thought content normal. Skin: two areas of erythema on left posterior leg. On the left calf two small ports of entry, no drainage, large are of erythema and induration but no pain during touch, also smaller port of entry on posterior thigh  PHQ2/9: Depression screen [facility] 2/9 12/20/2015 05/05/2015 02/02/2015  Decreased Interest 0 0 0  Down, Depressed, Hopeless 0 0 0  PHQ - 2 Score 0 0 0     Fall Risk: Fall Risk  12/20/2015 05/05/2015 02/02/2015  Falls in the past year? No No No    Functional Status Survey: Is the patient deaf or have difficulty hearing?: No Does the patient have difficulty seeing, even when wearing glasses/contacts?: No Does the patient have difficulty concentrating, remembering,  or making decisions?: No Does the patient have difficulty walking or climbing stairs?: No Does the patient have difficulty dressing or bathing?: No Does the patient have difficulty doing errands alone such as visiting a doctor's office or shopping?: No   Assessment & Plan  1. Benign essential HTN  - lisinopril-hydrochlorothiazide (PRINZIDE,ZESTORETIC) 20-12.5 MG tablet; Take 1 tablet by mouth every morning.  Dispense: 90 tablet; Refill: 1 - Comprehensive metabolic panel 2. Insect bite  Advised to increase loratadine to twice daily , keep area clean, likely a localized reaction , but if symptoms gets worse take antibiotics for cellulitis  - sulfamethoxazole-trimethoprim (BACTRIM DS,SEPTRA DS) 800-160 MG tablet; Take 1 tablet by mouth 2 (two) times daily.  Dispense: 14 tablet; Refill: 0  3. Visit for screening mammogram  - MM Digital Screening; Future  4. Dyslipidemia  - atorvastatin (LIPITOR) 40 MG tablet;  Take 1 tablet (40 mg total) by mouth daily.  Dispense: 90 tablet; Refill: 1 - Lipid panel  5. Perennial allergic rhinitis  Doing well   6. Obesity (BMI 30.0-34.9)  Discussed with the patient the risk posed by an increased BMI. Discussed importance of portion control, calorie counting and at least 150 minutes of physical activity weekly. Avoid sweet beverages and drink more water. Eat at least 6 servings of fruit and vegetables daily   7. Gastro-esophageal reflux disease without esophagitis  Continue prn medication   8. Prediabetes  Life style modification, she has taken a class in the past - Hemoglobin A1c

## 2016-03-20 ENCOUNTER — Ambulatory Visit: Admission: RE | Admit: 2016-03-20 | Payer: Self-pay | Source: Ambulatory Visit

## 2016-04-30 ENCOUNTER — Telehealth: Payer: Self-pay | Admitting: Family Medicine

## 2016-04-30 ENCOUNTER — Other Ambulatory Visit: Payer: Self-pay | Admitting: Family Medicine

## 2016-04-30 DIAGNOSIS — I10 Essential (primary) hypertension: Secondary | ICD-10-CM

## 2016-04-30 MED ORDER — LISINOPRIL-HYDROCHLOROTHIAZIDE 20-12.5 MG PO TABS
1.0000 | ORAL_TABLET | Freq: Every morning | ORAL | 1 refills | Status: DC
Start: 2016-04-30 — End: 2016-09-17

## 2016-04-30 NOTE — Telephone Encounter (Signed)
Pt [facility] [place]

## 2016-05-21 ENCOUNTER — Telehealth: Payer: Self-pay | Admitting: Family Medicine

## 2016-05-21 ENCOUNTER — Other Ambulatory Visit: Payer: Self-pay | Admitting: Family Medicine

## 2016-05-21 DIAGNOSIS — E785 Hyperlipidemia, unspecified: Secondary | ICD-10-CM

## 2016-05-21 MED ORDER — ATORVASTATIN CALCIUM 40 MG PO TABS: 40.0000 mg | ORAL_TABLET | Freq: Every day | ORAL | 1 refills | Status: DC

## 2016-05-21 MED ORDER — LORATADINE 10 MG PO TABS: 10.0000 mg | ORAL_TABLET | Freq: Two times a day (BID) | ORAL | 1 refills | Status: DC

## 2016-05-21 NOTE — Telephone Encounter (Signed)
Who is her GI, last colonoscopy in our chart is from 2 years ago. Did she have polyps?

## 2016-05-21 NOTE — Telephone Encounter (Signed)
Patient states she just received a fax from her Insurance company but informed her per our chart she is not due until 02/18/2019.

## 2016-05-21 NOTE — Telephone Encounter (Signed)
Requesting refills on Loratadine 10mg , &  Atorvastatin 40mg . [facility]. She is completely out of Atorvastatin. Patient received letter stating that it is time for her colonoscopy.

## 2016-05-28 ENCOUNTER — Ambulatory Visit
Admission: RE | Admit: 2016-05-28 | Discharge: 2016-05-28 | Disposition: A | Discharge status: Home / Self Care | Payer: BLUE CROSS/BLUE SHIELD | Source: Ambulatory Visit | Attending: Family Medicine | Admitting: Family Medicine

## 2016-05-28 ENCOUNTER — Other Ambulatory Visit: Payer: Self-pay | Admitting: Family Medicine

## 2016-05-28 DIAGNOSIS — Z1231 Encounter for screening mammogram for malignant neoplasm of breast: Secondary | ICD-10-CM | POA: Insufficient documentation

## 2016-06-22 ENCOUNTER — Encounter: Payer: BLUE CROSS/BLUE SHIELD | Admitting: Family Medicine

## 2016-09-17 ENCOUNTER — Ambulatory Visit (INDEPENDENT_AMBULATORY_CARE_PROVIDER_SITE_OTHER): Payer: BLUE CROSS/BLUE SHIELD | Admitting: Family Medicine

## 2016-09-17 ENCOUNTER — Encounter: Payer: Self-pay | Admitting: Family Medicine

## 2016-09-17 VITALS — BP 108/60 | HR 89 | Temp 98.1°F | Resp 16 | Ht 65.0 in | Wt 198.2 lb

## 2016-09-17 DIAGNOSIS — I10 Essential (primary) hypertension: Secondary | ICD-10-CM | POA: Diagnosis not present

## 2016-09-17 DIAGNOSIS — Z23 Encounter for immunization: Secondary | ICD-10-CM

## 2016-09-17 DIAGNOSIS — E538 Deficiency of other specified B group vitamins: Secondary | ICD-10-CM | POA: Diagnosis not present

## 2016-09-17 DIAGNOSIS — Z0000 Encounter for general adult medical examination without abnormal findings: Secondary | ICD-10-CM

## 2016-09-17 DIAGNOSIS — E785 Hyperlipidemia, unspecified: Secondary | ICD-10-CM | POA: Diagnosis not present

## 2016-09-17 DIAGNOSIS — E559 Vitamin D deficiency, unspecified: Secondary | ICD-10-CM

## 2016-09-17 DIAGNOSIS — K219 Gastro-esophageal reflux disease without esophagitis: Secondary | ICD-10-CM | POA: Diagnosis not present

## 2016-09-17 DIAGNOSIS — R7303 Prediabetes: Secondary | ICD-10-CM

## 2016-09-17 DIAGNOSIS — E66811 Obesity, class 1: Secondary | ICD-10-CM

## 2016-09-17 DIAGNOSIS — Z90710 Acquired absence of both cervix and uterus: Secondary | ICD-10-CM | POA: Diagnosis not present

## 2016-09-17 DIAGNOSIS — J3089 Other allergic rhinitis: Secondary | ICD-10-CM

## 2016-09-17 DIAGNOSIS — E669 Obesity, unspecified: Secondary | ICD-10-CM

## 2016-09-17 DIAGNOSIS — Z01419 Encounter for gynecological examination (general) (routine) without abnormal findings: Secondary | ICD-10-CM

## 2016-09-17 MED ORDER — LISINOPRIL-HYDROCHLOROTHIAZIDE 20-12.5 MG PO TABS: 1.0000 | ORAL_TABLET | Freq: Every morning | ORAL | 1 refills | Status: DC

## 2016-09-17 MED ORDER — ATORVASTATIN CALCIUM 40 MG PO TABS: 40.0000 mg | ORAL_TABLET | Freq: Every day | ORAL | 1 refills | Status: DC

## 2016-09-17 NOTE — Progress Notes (Signed)
Name: Latoya MartinezSabrina H Jackson   MRN: [id number]    DOB: Jul 11, 1961   Date:09/17/2016       Progress Note  Subjective  Chief Complaint  Chief Complaint  Patient presents with  . Annual Exam  . Hyperlipidemia  . Allergic Rhinitis   . Obesity  . Hypertension    HPI  Well woman: married, she had a hysterectomy but not sure if she has a cervix, colonoscopy and mammogram is up to date. No vaginal discharge, no breast lumps.   HTN: taking medication and denies side effects, denies chest pain or palpitation  Hyperlipidemia: taking Atorvastatin since July. ASCVD : 16% in the next 10 years. No side effects. No chest pain, no myalgias. She is compliant with medication.  GERD: symptoms heart burn is intermittent and takes prn medication, like Tums prn. No weight loss, no regurgitation  AR: taking Loratadine daily and symptoms are under control, no rhinorrhea or nasal congestion  Obesity/pre-diabetes: she has gained a lot of weight since last visit. She denies polyphagia, polydipsia or polyuria.  Patient Active Problem List   Diagnosis Date Noted  . Obesity (BMI 30.0-34.9) 02/02/2015  . Benign essential HTN 01/19/2015  . Dyslipidemia 01/19/2015  . 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 Gastro-esophageal reflux disease without esophagitis 01/19/2015  . Hive 01/19/2015  . Blood glucose elevated 01/19/2015  . B12 deficiency 01/19/2015  . Perennial allergic rhinitis 01/19/2015  . History of Helicobacter pylori infection 01/19/2015  . 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 AS (sickle cell trait) (HCC) 01/19/2015  . Tobacco use 01/19/2015  . Vitamin D deficiency 01/19/2015  . Solitary cyst of breast     Past Surgical History:  Procedure Laterality Date  . ABDOMINAL HYSTERECTOMY  2004  . BREAST BIOPSY Left    core- neg  . BREAST SURGERY Right 2008   Intraductal papilloma excised  . BREAST SURGERY Left 2009,2013   Benign breast tissue with dense fibrosis., Focal adenosis with a few small  intraductal microcalcifications.  . COLONOSCOPY  2010    Family History  Problem Relation Age of Onset  . Breast cancer Neg Hx     Social History   Social History  . Marital status: Married    Spouse name: N/A  . Number of children: N/A  . Years of education: N/A   Occupational History  . Not on file.   Social History Main Topics  . Smoking status: Former Smoker    Packs/day: 0.15    Years: 11.00    Types: Cigarettes    Start date: 02/01/2005    Quit date: 11/14/2015  . Smokeless tobacco: Never Used     Comment: smokes about 6 cigarettes a day  . Alcohol use 0.6 - 1.8 oz/week    1 - 3 Standard drinks or equivalent per week     Comment: 1-3 drinks per week  . Drug use: No  . Sexual activity: Yes    Partners: Male   Other Topics Concern  . Not on file   Social History Narrative  . No narrative on file     Current Outpatient Prescriptions:  .  aspirin 81 MG tablet, Take 81 mg by mouth daily., Disp: , Rfl:  .  atorvastatin (LIPITOR) 40 MG tablet, Take 1 tablet (40 mg total) by mouth daily., Disp: 90 tablet, Rfl: 1 .  Cholecalciferol (VITAMIN D) 2000 UNITS CAPS, Take 1 capsule by mouth daily., Disp: , Rfl:  .  Cyanocobalamin (B-12) 1000 MCG SUBL, Place 1 tablet under the  tongue daily., Disp: 90 each, Rfl: 1 .  lisinopril-hydrochlorothiazide (PRINZIDE,ZESTORETIC) 20-12.5 MG tablet, Take 1 tablet by mouth every morning., Disp: 90 tablet, Rfl: 1 .  loratadine (CLARITIN) 10 MG tablet, Take 1 tablet (10 mg total) by mouth 2 (two) times daily. TAKE 1 TABLET (10 MG TOTAL) BY MOUTH DAILY., Disp: 90 tablet, Rfl: 1  Allergies  Allergen Reactions  . Pollen Extract   . Tree Extract      ROS  Constitutional: Negative for fever, positive for  weight change.  Respiratory: Negative for cough and shortness of breath.   Cardiovascular: Negative for chest pain or palpitations.  Gastrointestinal: Negative for abdominal pain, no bowel changes.  Musculoskeletal: Negative for gait  problem or joint swelling.  Skin: Negative for rash.   Neurological: Negative for dizziness or headache.  No other specific complaints in a complete review of systems (except as listed in HPI above).  Objective  Vitals:   09/17/16 1416  BP: 108/60  Pulse: 89  Resp: 16  Temp: 98.1 F (36.7 C)  TempSrc: Oral  SpO2: 97%  Weight: 198 lb 3.2 oz (89.9 kg)  Height: 5\' 5"  (1.651 m)    Body mass index is 32.98 kg/m.  Physical Exam  Constitutional: Patient appears well-developed, obese. No distress.  HENT: Head: Normocephalic and atraumatic. Ears: B TMs ok, no erythema or effusion; Nose: Nose normal. Mouth/Throat: Oropharynx is clear and moist. No oropharyngeal exudate.  Eyes: Conjunctivae and EOM are normal. Pupils are equal, round, and reactive to light. No scleral icterus.  Neck: Normal range of motion. Neck supple. No JVD present. No thyromegaly present.  Cardiovascular: Normal rate, regular rhythm and normal heart sounds.  No murmur heard. No BLE edema. Pulmonary/Chest: Effort normal and breath sounds normal. No respiratory distress. Abdominal: Soft. Bowel sounds are normal, no distension. There is no tenderness. no masses Breast: no lumps or masses, no nipple discharge or rashes FEMALE GENITALIA:  External genitalia normal External urethra normal Vaginal vault normal without discharge or lesions Cervix absent Bimanual exam normal without masses RECTAL: not done Musculoskeletal: Normal range of motion, no joint effusions. No gross deformities Neurological: he is alert and oriented to person, place, and time. No cranial nerve deficit. Coordination, balance, strength, speech and gait are normal.  Skin: Skin is warm and dry. No rash noted. No erythema.  Psychiatric: Patient has a normal mood and affect. behavior is normal. Judgment and thought content normal.   PHQ2/9: Depression screen [facility] 2/9 09/17/2016 12/20/2015 05/05/2015 02/02/2015  Decreased Interest 0 0 0 0  Down,  Depressed, Hopeless 0 0 0 0  PHQ - 2 Score 0 0 0 0     Fall Risk: Fall Risk  09/17/2016 12/20/2015 05/05/2015 02/02/2015  Falls in the past year? No No No No     Functional Status Survey: Is the patient deaf or have difficulty hearing?: No Does the patient have difficulty seeing, even when wearing glasses/contacts?: No Does the patient have difficulty concentrating, remembering, or making decisions?: No Does the patient have difficulty walking or climbing stairs?: No Does the patient have difficulty dressing or bathing?: No Does the patient have difficulty doing errands alone such as visiting a doctor's office or shopping?: No    Assessment & Plan  1. Well woman exam  Discussed importance of 150 minutes of physical activity weekly, eat two servings of fish weekly, eat one serving of tree nuts ( cashews, pistachios, pecans, almonds.Marland Kitchen) every other day, eat 6 servings of fruit/vegetables daily and drink plenty  of water and avoid sweet beverages.   2. Dyslipidemia  - Lipid panel - atorvastatin (LIPITOR) 40 MG tablet; Take 1 tablet (40 mg total) by mouth daily.  Dispense: 90 tablet; Refill: 1  3. Prediabetes  Discussed life style modification also discussed GLP-1 agonist, there is not family history of thyroid cancer, discussed possible side effects of nausea  4. B12 deficiency  - Vitamin B12  5. Vitamin D deficiency  - VITAMIN D 25 Hydroxy (Vit-D Deficiency, Fractures)  6. Obesity (BMI 30.0-34.9)  - Hemoglobin A1c - Insulin, fasting  7. Gastro-esophageal reflux disease without esophagitis  Doing well   8. Perennial allergic rhinitis  Doing well at this time  9. Benign essential HTN  - COMPLETE METABOLIC PANEL WITH GFR - CBC with Differential/Platelet - lisinopril-hydrochlorothiazide (PRINZIDE,ZESTORETIC) 20-12.5 MG tablet; Take 1 tablet by mouth every morning.  Dispense: 90 tablet; Refill: 1  10. Need for Tdap vaccination  - Tdap vaccine greater than or equal  to 7yo IM

## 2016-09-20 LAB — COMPLETE METABOLIC PANEL WITH GFR
ALBUMIN: 4.3 g/dL (ref 3.6–5.1)
ALK PHOS: 65 U/L (ref 33–130)
ALT: 35 U/L — AB (ref 6–29)
AST: 24 U/L (ref 10–35)
BILIRUBIN TOTAL: 0.3 mg/dL (ref 0.2–1.2)
BUN: 14 mg/dL (ref 7–25)
CALCIUM: 9.5 mg/dL (ref 8.6–10.4)
CO2: 21 mmol/L (ref 20–31)
CREATININE: 0.64 mg/dL (ref 0.50–1.05)
Chloride: 105 mmol/L (ref 98–110)
GFR, Est African American: >89 mL/min (ref >=60)
GFR, Est Non African American: >89 mL/min (ref >=60)
GLUCOSE: 109 mg/dL — AB (ref 65–99)
Potassium: 4.2 mmol/L (ref 3.5–5.3)
SODIUM: 140 mmol/L (ref 135–146)
TOTAL PROTEIN: 7 g/dL (ref 6.1–8.1)

## 2016-09-20 LAB — LIPID PANEL
Cholesterol: 138 mg/dL (ref <200)
HDL: 60 mg/dL (ref >50)
LDL CALC: 66 mg/dL (ref <100)
TRIGLYCERIDES: 62 mg/dL (ref <150)
Total CHOL/HDL Ratio: 2.3 Ratio (ref <5.0)
VLDL: 12 mg/dL (ref <30)

## 2016-09-20 LAB — CBC WITH DIFFERENTIAL/PLATELET
BASOS ABS: 0 {cells}/uL (ref 0–200)
Basophils Relative: 0 %
Eosinophils Absolute: 240 cells/uL (ref 15–500)
Eosinophils Relative: 4 %
HCT: 36.8 % (ref 35.0–45.0)
HEMOGLOBIN: 12.5 g/dL (ref 11.7–15.5)
LYMPHS ABS: 2040 {cells}/uL (ref 850–3900)
LYMPHS PCT: 34 %
MCH: 31.2 pg (ref 27.0–33.0)
MCHC: 34 g/dL (ref 32.0–36.0)
MCV: 91.8 fL (ref 80.0–100.0)
MPV: 9.9 fL (ref 7.5–12.5)
Monocytes Absolute: 600 cells/uL (ref 200–950)
Monocytes Relative: 10 %
NEUTROS PCT: 52 %
Neutro Abs: 3120 cells/uL (ref 1500–7800)
Platelets: 339 10*3/uL (ref 140–400)
RBC: 4.01 MIL/uL (ref 3.80–5.10)
RDW: 13.4 % (ref 11.0–15.0)
WBC: 6 10*3/uL (ref 3.8–10.8)

## 2016-09-20 LAB — HEMOGLOBIN A1C
HEMOGLOBIN A1C: 5.5 % (ref <5.7)
Mean Plasma Glucose: 111 mg/dL

## 2016-09-20 LAB — VITAMIN B12: Vitamin B-12: 931 pg/mL (ref 200–1100)

## 2016-09-21 LAB — INSULIN, FASTING: Insulin fasting, serum: 21.9 u[IU]/mL — ABNORMAL HIGH (ref 2.0–19.6)

## 2016-09-21 LAB — VITAMIN D 25 HYDROXY (VIT D DEFICIENCY, FRACTURES): VIT D 25 HYDROXY: 55 ng/mL (ref 30–100)

## 2016-09-23 ENCOUNTER — Other Ambulatory Visit: Payer: Self-pay | Admitting: Family Medicine

## 2016-09-23 DIAGNOSIS — R7303 Prediabetes: Secondary | ICD-10-CM

## 2016-09-23 MED ORDER — SEMAGLUTIDE(0.25 OR 0.5MG/DOS) 2 MG/1.5ML ~~LOC~~ SOPN: 0.2000 mg | PEN_INJECTOR | SUBCUTANEOUS | 0 refills | Status: DC

## 2017-01-08 ENCOUNTER — Other Ambulatory Visit: Payer: Self-pay | Admitting: Family Medicine

## 2017-01-08 NOTE — Telephone Encounter (Signed)
Patient requesting refill of Claritin to [facility].

## 2017-02-23 ENCOUNTER — Other Ambulatory Visit: Payer: Self-pay | Admitting: Family Medicine

## 2017-03-22 ENCOUNTER — Ambulatory Visit: Payer: BLUE CROSS/BLUE SHIELD | Admitting: Family Medicine

## 2017-04-08 ENCOUNTER — Encounter: Payer: Self-pay | Admitting: Family Medicine

## 2017-04-08 ENCOUNTER — Ambulatory Visit (INDEPENDENT_AMBULATORY_CARE_PROVIDER_SITE_OTHER): Payer: 59 | Admitting: Family Medicine

## 2017-04-08 VITALS — BP 110/80 | HR 85 | Wt 192.7 lb

## 2017-04-08 DIAGNOSIS — K219 Gastro-esophageal reflux disease without esophagitis: Secondary | ICD-10-CM | POA: Diagnosis not present

## 2017-04-08 DIAGNOSIS — E785 Hyperlipidemia, unspecified: Secondary | ICD-10-CM

## 2017-04-08 DIAGNOSIS — R7303 Prediabetes: Secondary | ICD-10-CM

## 2017-04-08 DIAGNOSIS — D573 Sickle-cell trait: Secondary | ICD-10-CM | POA: Diagnosis not present

## 2017-04-08 DIAGNOSIS — I10 Essential (primary) hypertension: Secondary | ICD-10-CM | POA: Diagnosis not present

## 2017-04-08 DIAGNOSIS — Z1239 Encounter for other screening for malignant neoplasm of breast: Secondary | ICD-10-CM

## 2017-04-08 DIAGNOSIS — Z23 Encounter for immunization: Secondary | ICD-10-CM

## 2017-04-08 LAB — POCT GLYCOSYLATED HEMOGLOBIN (HGB A1C): HEMOGLOBIN A1C: 5.8

## 2017-04-08 MED ORDER — ATORVASTATIN CALCIUM 40 MG PO TABS: 40.0000 mg | ORAL_TABLET | Freq: Every day | ORAL | 1 refills | Status: DC

## 2017-04-08 MED ORDER — LISINOPRIL-HYDROCHLOROTHIAZIDE 20-12.5 MG PO TABS: 1.0000 | ORAL_TABLET | Freq: Every day | ORAL | 1 refills | Status: DC

## 2017-04-08 NOTE — Progress Notes (Signed)
Name: Latoya Jackson   MRN: [id number]    DOB: 1961/04/16   Date:04/08/2017       Progress Note  Subjective  Chief Complaint  Chief Complaint  Patient presents with  . Diabetes  . Hypertension  . Hyperlipidemia    HPI  HTN: taking medication and denies side effects, denies chest pain, dizziness or palpitation  Hyperlipidemia: taking Atorvastatin since July 2017. Her ASCVD was 16% in the next 10 years. No side effects. No chest pain, no myalgias. She is compliant with medication. We will recheck labs next visit, last labs had improved   GERD: symptoms heart burn is intermittent and takes prn medication, like Tums prn. No weight loss, no regurgitation  AR: taking Loratadine daily and symptoms are under control, no rhinorrhea or nasal congestion   Obesity/pre-diabetes: she has gained weight when she stopped smoking in 2017, but she has been eating healthier and has lost 6 lbs since last year. . She denies polyphagia, polydipsia or polyuria.  Sickle Cell trait: no problems, last hgb was normal   Patient Active Problem List   Diagnosis Date Noted  . Pre-diabetes 09/23/2016  . History of hysterectomy 09/17/2016  . Obesity (BMI 30.0-34.9) 02/02/2015  . Benign essential HTN 01/19/2015  . Dyslipidemia 01/19/2015  . 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 Gastro-esophageal reflux disease without esophagitis 01/19/2015  . Hive 01/19/2015  . Blood glucose elevated 01/19/2015  . B12 deficiency 01/19/2015  . Perennial allergic rhinitis 01/19/2015  . History of Helicobacter pylori infection 01/19/2015  . 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 AS (sickle cell trait) (HCC) 01/19/2015  . Tobacco use 01/19/2015  . Vitamin D deficiency 01/19/2015  . Solitary cyst of breast     Past Surgical History:  Procedure Laterality Date  . ABDOMINAL HYSTERECTOMY  2004  . BREAST BIOPSY Left    core- neg  . BREAST SURGERY Right 2008   Intraductal papilloma excised  . BREAST SURGERY Left  2009,2013   Benign breast tissue with dense fibrosis., Focal adenosis with a few small intraductal microcalcifications.  . COLONOSCOPY  2010    Family History  Problem Relation Age of Onset  . Hypertension Mother   . Breast cancer Neg Hx     Social History   Social History  . Marital status: Married    Spouse name: N/A  . Number of children: N/A  . Years of education: N/A   Occupational History  . Not on file.   Social History Main Topics  . Smoking status: Former Smoker    Packs/day: 0.15    Years: 11.00    Types: Cigarettes    Start date: 02/01/2005    Quit date: 11/14/2015  . Smokeless tobacco: Never Used     Comment: smokes about 6 cigarettes a day  . Alcohol use 0.6 - 1.8 oz/week    1 - 3 Standard drinks or equivalent per week     Comment: 1-3 drinks per week  . Drug use: No  . Sexual activity: Yes    Partners: Male   Other Topics Concern  . Not on file   Social History Narrative   Married   They have 5 grandchildren, one is in [place].      Current Outpatient Prescriptions:  .  aspirin 81 MG tablet, Take 81 mg by mouth daily., Disp: , Rfl:  .  atorvastatin (LIPITOR) 40 MG tablet, Take 1 tablet (40 mg total) by mouth daily., Disp: 90 tablet, Rfl: 1 .  Cholecalciferol (VITAMIN D)  2000 UNITS CAPS, Take 1 capsule by mouth daily., Disp: , Rfl:  .  Cyanocobalamin (B-12) 1000 MCG SUBL, Place 1 tablet under the tongue daily., Disp: 90 each, Rfl: 1 .  lisinopril-hydrochlorothiazide (PRINZIDE,ZESTORETIC) 20-12.5 MG tablet, Take 1 tablet by mouth daily., Disp: 90 tablet, Rfl: 1 .  loratadine (CLARITIN) 10 MG tablet, TAKE 1 TABLET BY MOUTH TWICE DAILY, Disp: 90 tablet, Rfl: 0  Allergies  Allergen Reactions  . Pollen Extract   . Tree Extract      ROS  Constitutional: Negative for fever, positive for  weight change.  Respiratory: Negative for cough and shortness of breath.   Cardiovascular: Negative for chest pain or palpitations.  Gastrointestinal: Negative for  abdominal pain, no bowel changes.  Musculoskeletal: Negative for gait problem or joint swelling.  Skin: Negative for rash.  Neurological: Negative for dizziness or headache.  No other specific complaints in a complete review of systems (except as listed in HPI above).  Objective  Vitals:   04/08/17 1453  BP: 110/80  Pulse: 85  SpO2: 98%  Weight: 192 lb 11.2 oz (87.4 kg)    Body mass index is 32.07 kg/m.  Physical Exam  Constitutional: Patient appears well-developed and well-nourished. Obese  No distress.  HEENT: head atraumatic, normocephalic, pupils equal and reactive to light, neck supple, throat within normal limits Cardiovascular: Normal rate, regular rhythm and normal heart sounds.  No murmur heard. No BLE edema. Pulmonary/Chest: Effort normal and breath sounds normal. No respiratory distress. Abdominal: Soft.  There is no tenderness. Psychiatric: Patient has a normal mood and affect. behavior is normal. Judgment and thought content normal.  No results found for this or any previous visit (from the past 2160 hour(s)).   PHQ2/9: Depression screen [facility] 2/9 09/17/2016 12/20/2015 05/05/2015 02/02/2015  Decreased Interest 0 0 0 0  Down, Depressed, Hopeless 0 0 0 0  PHQ - 2 Score 0 0 0 0     Fall Risk: Fall Risk  04/08/2017 09/17/2016 12/20/2015 05/05/2015 02/02/2015  Falls in the past year? No No No No No     Assessment & Plan  1. Benign essential HTN  - lisinopril-hydrochlorothiazide (PRINZIDE,ZESTORETIC) 20-12.5 MG tablet; Take 1 tablet by mouth daily.  Dispense: 90 tablet; Refill: 1  2. Need for influenza vaccination  - Flu Vaccine QUAD 6+ mos PF IM (Fluarix Quad PF)  3. AS (sickle cell trait) (HCC)  Patient is aware  4. Dyslipidemia  - atorvastatin (LIPITOR) 40 MG tablet; Take 1 tablet (40 mg total) by mouth daily.  Dispense: 90 tablet; Refill: 1  5. Prediabetes  -hgbA1C  6. Gastro-esophageal reflux disease without esophagitis  Under control , takes prn  medication   7. Breast cancer screening  - MM Digital Screening; Future

## 2017-04-12 ENCOUNTER — Other Ambulatory Visit: Payer: Self-pay | Admitting: Family Medicine

## 2017-04-12 DIAGNOSIS — J3089 Other allergic rhinitis: Secondary | ICD-10-CM

## 2017-04-12 NOTE — Telephone Encounter (Signed)
Patient requesting refill of Loratadine to [facility].

## 2017-07-30 ENCOUNTER — Ambulatory Visit
Admission: RE | Admit: 2017-07-30 | Discharge: 2017-07-30 | Disposition: A | Discharge status: Home / Self Care | Payer: 59 | Source: Ambulatory Visit | Attending: Family Medicine | Admitting: Family Medicine

## 2017-07-30 DIAGNOSIS — Z1231 Encounter for screening mammogram for malignant neoplasm of breast: Secondary | ICD-10-CM | POA: Diagnosis present

## 2017-07-30 DIAGNOSIS — Z1239 Encounter for other screening for malignant neoplasm of breast: Secondary | ICD-10-CM

## 2017-09-27 DIAGNOSIS — L249 Irritant contact dermatitis, unspecified cause: Secondary | ICD-10-CM | POA: Diagnosis not present

## 2017-09-30 ENCOUNTER — Other Ambulatory Visit: Payer: Self-pay | Admitting: Family Medicine

## 2017-09-30 DIAGNOSIS — I10 Essential (primary) hypertension: Secondary | ICD-10-CM

## 2017-09-30 NOTE — Telephone Encounter (Signed)
Refill request for Hypertension medication:  Lisinopril-HCTZ 20-12.5 mg  Last office visit pertaining to hypertension: 04/08/2017  BP Readings from Last 3 Encounters:  04/08/17 110/80  09/17/16 108/60  12/20/15 138/74    Lab Results  Component Value Date   CREATININE 0.64 09/20/2016   BUN 14 09/20/2016   NA 140 09/20/2016   K 4.2 09/20/2016   CL 105 09/20/2016   CO2 21 09/20/2016    Follow-up on file. 10/04/2017

## 2017-10-04 ENCOUNTER — Encounter: Payer: Self-pay | Admitting: Family Medicine

## 2017-10-04 ENCOUNTER — Ambulatory Visit (INDEPENDENT_AMBULATORY_CARE_PROVIDER_SITE_OTHER): Payer: 59 | Admitting: Family Medicine

## 2017-10-04 VITALS — BP 130/80 | HR 75 | Temp 98.1°F | Resp 14 | Ht 65.35 in | Wt 179.3 lb

## 2017-10-04 DIAGNOSIS — R739 Hyperglycemia, unspecified: Secondary | ICD-10-CM

## 2017-10-04 DIAGNOSIS — E538 Deficiency of other specified B group vitamins: Secondary | ICD-10-CM

## 2017-10-04 DIAGNOSIS — I10 Essential (primary) hypertension: Secondary | ICD-10-CM | POA: Diagnosis not present

## 2017-10-04 DIAGNOSIS — E785 Hyperlipidemia, unspecified: Secondary | ICD-10-CM | POA: Diagnosis not present

## 2017-10-04 DIAGNOSIS — R232 Flushing: Secondary | ICD-10-CM

## 2017-10-04 DIAGNOSIS — Z01419 Encounter for gynecological examination (general) (routine) without abnormal findings: Secondary | ICD-10-CM | POA: Diagnosis not present

## 2017-10-04 DIAGNOSIS — E559 Vitamin D deficiency, unspecified: Secondary | ICD-10-CM | POA: Diagnosis not present

## 2017-10-04 DIAGNOSIS — E7800 Pure hypercholesterolemia, unspecified: Secondary | ICD-10-CM | POA: Diagnosis not present

## 2017-10-04 DIAGNOSIS — Z23 Encounter for immunization: Secondary | ICD-10-CM

## 2017-10-04 MED ORDER — LISINOPRIL-HYDROCHLOROTHIAZIDE 20-12.5 MG PO TABS: 1.0000 | ORAL_TABLET | Freq: Every day | ORAL | 1 refills | Status: DC

## 2017-10-04 MED ORDER — ZOSTER VAC RECOMB ADJUVANTED 50 MCG/0.5ML IM SUSR: 0.5000 mL | Freq: Once | INTRAMUSCULAR | 1 refills | Status: AC

## 2017-10-04 MED ORDER — CLONIDINE HCL 0.1 MG PO TABS: 0.1000 mg | ORAL_TABLET | Freq: Every day | ORAL | 1 refills | Status: DC

## 2017-10-04 MED ORDER — ATORVASTATIN CALCIUM 40 MG PO TABS: 40.0000 mg | ORAL_TABLET | Freq: Every day | ORAL | 1 refills | Status: DC

## 2017-10-04 NOTE — Progress Notes (Signed)
Name: Latoya Jackson   MRN: [id number]    DOB: 1961-03-20   Date:10/04/2017       Progress Note  Subjective  Chief Complaint  Chief Complaint  Patient presents with  . Annual Exam  . Follow-up    HPI   Patient presents for annual CPE and follow up  HTN: taking medication and denies side effects, denies chest pain, dizziness or palpitation  Hyperlipidemia: taking Atorvastatin since July 2017. Her ASCVD was 16% in the next 10 years. No side effects. No chest pain, no myalgias. She is compliant with medication. We will recheck labs today  GERD: doing well with life style modification   Hot flashes: she has noticed worsening of symptoms, night sweats and hot flashes, affecting her sleep, we will start medication  Obesity/pre-diabetes: she has gained weight when she stopped smoking in 2017, but she has been eating healthier and has lost 14 lbs with physical activity and healthier diet.    USPSTF grade A and B recommendations  Depression:  Depression screen [facility] 2/9 09/17/2016 12/20/2015 05/05/2015 02/02/2015  Decreased Interest 0 0 0 0  Down, Depressed, Hopeless 0 0 0 0  PHQ - 2 Score 0 0 0 0   Hypertension: BP Readings from Last 3 Encounters:  10/04/17 130/80  04/08/17 110/80  09/17/16 108/60   Obesity: Wt Readings from Last 3 Encounters:  10/04/17 179 lb 4.8 oz (81.3 kg)  04/08/17 192 lb 11.2 oz (87.4 kg)  09/17/16 198 lb 3.2 oz (89.9 kg)   BMI Readings from Last 3 Encounters:  10/04/17 29.51 kg/m  04/08/17 32.07 kg/m  09/17/16 32.98 kg/m     hep C: up to date STD testing and prevention (chl/gon/syphilis): N/A Intimate partner violence:negative  Sexual History/Pain during Intercourse: no pain  Menstrual History/LMP/Abnormal Bleeding: s/p hysterectomy  Incontinence Symptoms:   Advanced Care Planning: A voluntary discussion about advance care planning including the explanation and discussion of advance directives.  Discussed health care proxy and Living will,  and the patient was able to identify a health care proxy as husband.  Patient does have a living will at present time.  Breast cancer:  HM Mammogram  Date Value Ref Range Status  06/04/2013 Normal  Final    BRCA gene screening: N/A Cervical cancer screening: not indicated   Osteoporosis:  HM Dexa Scan  Date Value Ref Range Status  09/13/2009 Normal (-0.7/0.1)  Final   Lipids:  Lab Results  Component Value Date   CHOL 138 09/20/2016   CHOL 223 (H) 02/02/2015   CHOL 220 (A) 11/18/2013   Lab Results  Component Value Date   HDL 60 09/20/2016   HDL 56 02/02/2015   HDL 161 (A) 11/18/2013   Lab Results  Component Value Date   LDLCALC 66 09/20/2016   LDLCALC 149 (H) 02/02/2015   LDLCALC 143 11/18/2013   Lab Results  Component Value Date   TRIG 62 09/20/2016   TRIG 89 02/02/2015   TRIG 79 11/18/2013   Lab Results  Component Value Date   CHOLHDL 2.3 09/20/2016   CHOLHDL 4.0 02/02/2015   No results found for: LDLDIRECT  Glucose:  Glucose  Date Value Ref Range Status  02/02/2015 95 65 - 99 mg/dL Final   Glucose, Bld  Date Value Ref Range Status  09/20/2016 109 (H) 65 - 99 mg/dL Final    Skin cancer: seeing dermatologist  Colorectal cancer: 2015  Lung cancer:   Low Dose CT Chest recommended if Age 44-80 years, 30 pack-year  currently smoking OR have quit w/in 15years. Patient does not qualify.   FYT:WKMQK    Patient Active Problem List   Diagnosis Date Noted  . Pre-diabetes 09/23/2016  . History of hysterectomy 09/17/2016  . Obesity (BMI 30.0-34.9) 02/02/2015  . Benign essential HTN 01/19/2015  . Dyslipidemia 01/19/2015  . 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 Gastro-esophageal reflux disease without esophagitis 01/19/2015  . Hive 01/19/2015  . Blood glucose elevated 01/19/2015  . B12 deficiency 01/19/2015  . Perennial allergic rhinitis 01/19/2015  . History of Helicobacter pylori infection 01/19/2015  . 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 AS (sickle cell  trait) ([place]) 01/19/2015  . Tobacco use 01/19/2015  . Vitamin D deficiency 01/19/2015  . Solitary cyst of breast     Past Surgical History:  Procedure Laterality Date  . ABDOMINAL HYSTERECTOMY  07-Sep-2002  . BREAST BIOPSY Left    core- neg  . BREAST SURGERY Right 09/07/2006   Intraductal papilloma excised  . BREAST SURGERY Left 2007-09-08   Benign breast tissue with dense fibrosis., Focal adenosis with a few small intraductal microcalcifications.  . COLONOSCOPY  2008/09/07    Family History  Problem Relation Age of Onset  . Hypertension Mother   . Breast cancer Neg Hx     Social History   Socioeconomic History  . Marital status: Married    Spouse name: Quillian Quince   . Number of children: 1  . Years of education: Not on file  . Highest education level: Associate degree: occupational, Hotel manager, or vocational program  Occupational History  . Occupation: case Freight forwarder     Comment: helps homeless to find housing [  Social Needs  . Financial resource strain: Not hard at all  . Food insecurity:    Worry: Never true    Inability: Never true  . Transportation needs:    Medical: No    Non-medical: No  Tobacco Use  . Smoking status: Former Smoker    Packs/day: 0.15    Years: 11.00    Pack years: 1.65    Types: Cigarettes    Start date: 02/01/2005    Last attempt to quit: 11/14/2015    Years since quitting: 1.8  . Smokeless tobacco: Never Used  Substance and Sexual Activity  . Alcohol use: Yes    Alcohol/week: 0.6 - 1.8 oz    Types: 1 - 3 Standard drinks or equivalent per week    Comment: 1-3 drinks per week  . Drug use: No  . Sexual activity: Yes    Partners: Male  Lifestyle  . Physical activity:    Days per week: 4 days    Minutes per session: 40 min  . Stress: Not at all  Relationships  . Social connections:    Talks on phone: More than three times a week    Gets together: Three times a week    Attends religious service: More than 4 times per year    Active member of club or  organization: Yes    Attends meetings of clubs or organizations: More than 4 times per year    Relationship status: Married  . Intimate partner violence:    Fear of current or ex partner: No    Emotionally abused: No    Physically abused: No    Forced sexual activity: No  Other Topics Concern  . Not on file  Social History Narrative   Married, husband had 3 children ( one died 07-Sep-2009) and they have one child together   They have  6 grandchildren, one is in [place].      Current Outpatient Medications:  .  atorvastatin (LIPITOR) 40 MG tablet, Take 1 tablet (40 mg total) by mouth daily., Disp: 90 tablet, Rfl: 1 .  Cholecalciferol (VITAMIN D) 2000 UNITS CAPS, Take 1 capsule by mouth daily., Disp: , Rfl:  .  Cyanocobalamin (B-12) 1000 MCG SUBL, Place 1 tablet under the tongue daily., Disp: 90 each, Rfl: 1 .  lisinopril-hydrochlorothiazide (PRINZIDE,ZESTORETIC) 20-12.5 MG tablet, Take 1 tablet by mouth daily., Disp: 90 tablet, Rfl: 1 .  loratadine (CLARITIN) 10 MG tablet, TAKE 1 TABLET BY MOUTH TWICE DAILY, Disp: 90 tablet, Rfl: 3 .  triamcinolone cream (KENALOG) 0.1 %, Apply 1 g topically 2 (two) times daily., Disp: , Rfl: 0  Allergies  Allergen Reactions  . Pollen Extract   . Tree Extract      ROS   Constitutional: Negative for fever or weight change.  Respiratory: Negative for cough and shortness of breath.   Cardiovascular: Negative for chest pain or palpitations.  Gastrointestinal: Negative for abdominal pain, no bowel changes.  Musculoskeletal: Negative for gait problem or joint swelling.  Skin: Negative for rash.  Neurological: Negative for dizziness or headache.  No other specific complaints in a complete review of systems (except as listed in HPI above).   Objective  Vitals:   10/04/17 0823  BP: 130/80  Pulse: 75  Resp: 14  Temp: 98.1 F (36.7 C)  TempSrc: Oral  SpO2: 98%  Weight: 179 lb 4.8 oz (81.3 kg)  Height: 5' 5.35" (1.66 m)    Body mass index is 29.51  kg/m.  Physical Exam  Constitutional: Patient appears well-developed and well-nourished. No distress.  HENT: Head: Normocephalic and atraumatic. Ears: B TMs ok, no erythema or effusion; Nose: Nose normal. Mouth/Throat: Oropharynx is clear and moist. No oropharyngeal exudate.  Eyes: Conjunctivae and EOM are normal. Pupils are equal, round, and reactive to light. No scleral icterus.  Neck: Normal range of motion. Neck supple. No JVD present. No thyromegaly present.  Cardiovascular: Normal rate, regular rhythm and normal heart sounds.  No murmur heard. No BLE edema. Pulmonary/Chest: Effort normal and breath sounds normal. No respiratory distress. Abdominal: Soft. Bowel sounds are normal, no distension. There is no tenderness. no masses Breast: no lumps or masses, no nipple discharge or rashes FEMALE GENITALIA:  External genitalia normal External urethra normal Pelvic not done RECTAL: not done Musculoskeletal: Normal range of motion, no joint effusions. No gross deformities Neurological: he is alert and oriented to person, place, and time. No cranial nerve deficit. Coordination, balance, strength, speech and gait are normal.  Skin: Skin is warm and dry. No rash noted. No erythema.  Psychiatric: Patient has a normal mood and affect. behavior is normal. Judgment and thought content normal.    PHQ2/9: Depression screen [facility] 2/9 09/17/2016 12/20/2015 05/05/2015 02/02/2015  Decreased Interest 0 0 0 0  Down, Depressed, Hopeless 0 0 0 0  PHQ - 2 Score 0 0 0 0     Fall Risk: Fall Risk  10/04/2017 04/08/2017 09/17/2016 12/20/2015 05/05/2015  Falls in the past year? _0     Functional Status Survey: Is the patient deaf or have difficulty hearing?: No Does the patient have difficulty seeing, even when wearing glasses/contacts?: No Does the patient have difficulty concentrating, remembering, or making decisions?: No Does the patient have difficulty walking or climbing stairs?: No Does the  patient have difficulty dressing or bathing?: No Does the patient have difficulty doing  errands alone such as visiting a doctor's office or shopping?: No   Assessment & Plan  1. Well woman exam  Discussed importance of 150 minutes of physical activity weekly, eat two servings of fish weekly, eat one serving of tree nuts ( cashews, pistachios, pecans, almonds.Marland Kitchen) every other day, eat 6 servings of fruit/vegetables daily and drink plenty of water and avoid sweet beverages.   2. Dyslipidemia  - atorvastatin (LIPITOR) 40 MG tablet; Take 1 tablet (40 mg total) by mouth daily.  Dispense: 90 tablet; Refill: 1  3. Benign essential HTN  - lisinopril-hydrochlorothiazide (PRINZIDE,ZESTORETIC) 20-12.5 MG tablet; Take 1 tablet by mouth daily.  Dispense: 90 tablet; Refill: 1 - CBC with Differential/Platelet - COMPLETE METABOLIC PANEL WITH GFR  4. Need for shingles vaccine  - Zoster Vaccine Adjuvanted [facility]) injection; Inject 0.5 mLs into the muscle once for 1 dose.  Dispense: 0.5 mL; Refill: 1  5. Hot flashes  - TSH Worse at night, we will add Clonidine qhs    6. Pure hypercholesterolemia  - Lipid panel  7. Hyperglycemia  - Hemoglobin A1c  8. B12 deficiency  - Vitamin B12  9. Vitamin D deficiency  - VITAMIN D 25 Hydroxy (Vit-D Deficiency, Fractures)

## 2017-10-04 NOTE — Patient Instructions (Signed)
Preventive Care 40-64 Years, Female Preventive care refers to lifestyle choices and visits with your health care provider that can promote health and wellness. What does preventive care include?  A yearly physical exam. This is also called an annual well check.  Dental exams once or twice a year.  Routine eye exams. Ask your health care provider how often you should have your eyes checked.  Personal lifestyle choices, including: ? Daily care of your teeth and gums. ? Regular physical activity. ? Eating a healthy diet. ? Avoiding tobacco and drug use. ? Limiting alcohol use. ? Practicing safe sex. ? Taking low-dose aspirin daily starting at age 58. ? Taking vitamin and mineral supplements as recommended by your health care provider. What happens during an annual well check? The services and screenings done by your health care provider during your annual well check will depend on your age, overall health, lifestyle risk factors, and family history of disease. Counseling Your health care provider may ask you questions about your:  Alcohol use.  Tobacco use.  Drug use.  Emotional well-being.  Home and relationship well-being.  Sexual activity.  Eating habits.  Work and work Statistician.  Method of birth control.  Menstrual cycle.  Pregnancy history.  Screening You may have the following tests or measurements:  Height, weight, and BMI.  Blood pressure.  Lipid and cholesterol levels. These may be checked every 5 years, or more frequently if you are over 81 years old.  Skin check.  Lung cancer screening. You may have this screening every year starting at age 78 if you have a 30-pack-year history of smoking and currently smoke or have quit within the past 15 years.  Fecal occult blood test (FOBT) of the stool. You may have this test every year starting at age 65.  Flexible sigmoidoscopy or colonoscopy. You may have a sigmoidoscopy every 5 years or a colonoscopy  every 10 years starting at age 30.  Hepatitis C blood test.  Hepatitis B blood test.  Sexually transmitted disease (STD) testing.  Diabetes screening. This is done by checking your blood sugar (glucose) after you have not eaten for a while (fasting). You may have this done every 1-3 years.  Mammogram. This may be done every 1-2 years. Talk to your health care provider about when you should start having regular mammograms. This may depend on whether you have a family history of breast cancer.  BRCA-related cancer screening. This may be done if you have a family history of breast, ovarian, tubal, or peritoneal cancers.  Pelvic exam and Pap test. This may be done every 3 years starting at age 80. Starting at age 36, this may be done every 5 years if you have a Pap test in combination with an HPV test.  Bone density scan. This is done to screen for osteoporosis. You may have this scan if you are at high risk for osteoporosis.  Discuss your test results, treatment options, and if necessary, the need for more tests with your health care provider. Vaccines Your health care provider may recommend certain vaccines, such as:  Influenza vaccine. This is recommended every year.  Tetanus, diphtheria, and acellular pertussis (Tdap, Td) vaccine. You may need a Td booster every 10 years.  Varicella vaccine. You may need this if you have not been vaccinated.  Zoster vaccine. You may need this after age 5.  Measles, mumps, and rubella (MMR) vaccine. You may need at least one dose of MMR if you were born in  1957 or later. You may also need a second dose.  Pneumococcal 13-valent conjugate (PCV13) vaccine. You may need this if you have certain conditions and were not previously vaccinated.  Pneumococcal polysaccharide (PPSV23) vaccine. You may need one or two doses if you smoke cigarettes or if you have certain conditions.  Meningococcal vaccine. You may need this if you have certain  conditions.  Hepatitis A vaccine. You may need this if you have certain conditions or if you travel or work in places where you may be exposed to hepatitis A.  Hepatitis B vaccine. You may need this if you have certain conditions or if you travel or work in places where you may be exposed to hepatitis B.  Haemophilus influenzae type b (Hib) vaccine. You may need this if you have certain conditions.  Talk to your health care provider about which screenings and vaccines you need and how often you need them. This information is not intended to replace advice given to you by your health care provider. Make sure you discuss any questions you have with your health care provider. Document Released: 07/29/2015 Document Revised: 03/21/2016 Document Reviewed: 05/03/2015 Elsevier Interactive Patient Education  2018 Elsevier Inc.  

## 2017-10-05 LAB — COMPLETE METABOLIC PANEL WITH GFR
AG RATIO: 1.7 (calc) (ref 1.0–2.5)
ALBUMIN MSPROF: 4.8 g/dL (ref 3.6–5.1)
ALT: 26 U/L (ref 6–29)
AST: 24 U/L (ref 10–35)
Alkaline phosphatase (APISO): 60 U/L (ref 33–130)
BUN: 11 mg/dL (ref 7–25)
CALCIUM: 9.9 mg/dL (ref 8.6–10.4)
CO2: 27 mmol/L (ref 20–32)
Chloride: 104 mmol/L (ref 98–110)
Creat: 0.66 mg/dL (ref 0.50–1.05)
GFR, EST NON AFRICAN AMERICAN: 97 mL/min/{1.73_m2} (ref >=60)
GFR, Est African American: 112 mL/min/{1.73_m2} (ref >=60)
GLOBULIN: 2.8 g/dL (ref 1.9–3.7)
Glucose, Bld: 91 mg/dL (ref 65–99)
POTASSIUM: 4.2 mmol/L (ref 3.5–5.3)
SODIUM: 140 mmol/L (ref 135–146)
TOTAL PROTEIN: 7.6 g/dL (ref 6.1–8.1)
Total Bilirubin: 0.5 mg/dL (ref 0.2–1.2)

## 2017-10-05 LAB — HEMOGLOBIN A1C
EAG (MMOL/L): 6.5 (calc)
Hgb A1c MFr Bld: 5.7 % of total Hgb — ABNORMAL HIGH (ref <5.7)
Mean Plasma Glucose: 117 (calc)

## 2017-10-05 LAB — CBC WITH DIFFERENTIAL/PLATELET
BASOS PCT: 0.5 %
Basophils Absolute: 31 cells/uL (ref 0–200)
EOS ABS: 238 {cells}/uL (ref 15–500)
Eosinophils Relative: 3.9 %
HEMATOCRIT: 39.9 % (ref 35.0–45.0)
HEMOGLOBIN: 13.6 g/dL (ref 11.7–15.5)
LYMPHS ABS: 2495 {cells}/uL (ref 850–3900)
MCH: 31 pg (ref 27.0–33.0)
MCHC: 34.1 g/dL (ref 32.0–36.0)
MCV: 90.9 fL (ref 80.0–100.0)
MPV: 10.5 fL (ref 7.5–12.5)
Monocytes Relative: 6.2 %
Neutro Abs: 2959 cells/uL (ref 1500–7800)
Neutrophils Relative %: 48.5 %
Platelets: 378 10*3/uL (ref 140–400)
RBC: 4.39 10*6/uL (ref 3.80–5.10)
RDW: 12.1 % (ref 11.0–15.0)
Total Lymphocyte: 40.9 %
WBC: 6.1 10*3/uL (ref 3.8–10.8)
WBCMIX: 378 {cells}/uL (ref 200–950)

## 2017-10-05 LAB — LIPID PANEL
Cholesterol: 177 mg/dL (ref <200)
HDL: 63 mg/dL (ref >50)
LDL CHOLESTEROL (CALC): 98 mg/dL
Non-HDL Cholesterol (Calc): 114 mg/dL (calc) (ref <130)
TRIGLYCERIDES: 73 mg/dL (ref <150)
Total CHOL/HDL Ratio: 2.8 (calc) (ref <5.0)

## 2017-10-05 LAB — TSH: TSH: 1.47 mIU/L (ref 0.40–4.50)

## 2017-10-05 LAB — VITAMIN B12: VITAMIN B 12: 1074 pg/mL (ref 200–1100)

## 2017-10-05 LAB — VITAMIN D 25 HYDROXY (VIT D DEFICIENCY, FRACTURES): Vit D, 25-Hydroxy: 45 ng/mL (ref 30–100)

## 2017-10-18 DIAGNOSIS — L249 Irritant contact dermatitis, unspecified cause: Secondary | ICD-10-CM | POA: Diagnosis not present

## 2017-11-19 ENCOUNTER — Telehealth: Payer: Self-pay

## 2017-11-19 NOTE — Telephone Encounter (Signed)
Copied from CRM [id number]. Topic: Appointment Scheduling - Scheduling Inquiry for Clinic >> Nov 19, 2017 10:32 AM Oneal Grout wrote: Reason for CRM: patient has a sinus infection with fever 102, requesting to be seen today. Able to work in?

## 2017-11-19 NOTE — Telephone Encounter (Signed)
Left voice message on pt cell informing her that she can come NOW per tiffany.

## 2017-11-19 NOTE — Telephone Encounter (Signed)
Patient called back and I advised her she missed the opportunity for an appointment. Patient would like a call back at earliest convenience.

## 2017-11-19 NOTE — Telephone Encounter (Signed)
Patient states if patient wants to come now she can because she is going to need a chest x-ray. Please ask if she is coughing and if she is we will order and she needs before coming in.

## 2017-11-19 NOTE — Telephone Encounter (Signed)
Copied from CRM [id number]. Topic: Appointment Scheduling - Scheduling Inquiry for Clinic >> Nov 19, 2017 10:32 AM Oneal Grout wrote: Reason for CRM: patient has a sinus infection with fever 102, requesting to be seen today. Able to work in?  >> Nov 19, 2017  2:23 PM Terisa Starr wrote: Patient said she has not heard from anyone and wants to know can Dr Carlynn Purl just send her in an antibiotic since there is no available appointments  [facility] on [address]

## 2017-11-20 ENCOUNTER — Ambulatory Visit (INDEPENDENT_AMBULATORY_CARE_PROVIDER_SITE_OTHER): Payer: 59 | Admitting: Family Medicine

## 2017-11-20 ENCOUNTER — Encounter: Payer: Self-pay | Admitting: Family Medicine

## 2017-11-20 VITALS — BP 130/90 | HR 88 | Temp 98.7°F | Resp 16 | Ht 65.0 in | Wt 183.9 lb

## 2017-11-20 DIAGNOSIS — R05 Cough: Secondary | ICD-10-CM

## 2017-11-20 DIAGNOSIS — R509 Fever, unspecified: Secondary | ICD-10-CM | POA: Diagnosis not present

## 2017-11-20 DIAGNOSIS — D573 Sickle-cell trait: Secondary | ICD-10-CM

## 2017-11-20 DIAGNOSIS — R059 Cough, unspecified: Secondary | ICD-10-CM

## 2017-11-20 LAB — CBC WITH DIFFERENTIAL/PLATELET
BASOS PCT: 0.6 %
Basophils Absolute: 31 cells/uL (ref 0–200)
EOS ABS: 138 {cells}/uL (ref 15–500)
Eosinophils Relative: 2.7 %
HCT: 37 % (ref 35.0–45.0)
Hemoglobin: 12.9 g/dL (ref 11.7–15.5)
Lymphs Abs: 1132 cells/uL (ref 850–3900)
MCH: 31.5 pg (ref 27.0–33.0)
MCHC: 34.9 g/dL (ref 32.0–36.0)
MCV: 90.2 fL (ref 80.0–100.0)
MPV: 10.6 fL (ref 7.5–12.5)
Monocytes Relative: 15 %
NEUTROS PCT: 59.5 %
Neutro Abs: 3035 cells/uL (ref 1500–7800)
PLATELETS: 309 10*3/uL (ref 140–400)
RBC: 4.1 10*6/uL (ref 3.80–5.10)
RDW: 12.5 % (ref 11.0–15.0)
TOTAL LYMPHOCYTE: 22.2 %
WBC: 5.1 10*3/uL (ref 3.8–10.8)
WBCMIX: 765 {cells}/uL (ref 200–950)

## 2017-11-20 MED ORDER — AZITHROMYCIN 250 MG PO TABS: ORAL_TABLET | ORAL | 0 refills | Status: DC

## 2017-11-20 MED ORDER — CEFTRIAXONE SODIUM 1 G IJ SOLR: 1.0000 g | Freq: Once | INTRAMUSCULAR | Status: DC

## 2017-11-20 MED ORDER — LIDOCAINE HCL (PF) 1 % IJ SOLN
2.0000 mL | Freq: Once | INTRAMUSCULAR | Status: AC
  Administered 2017-11-20: 2 mL via INTRADERMAL

## 2017-11-20 MED ORDER — BENZONATATE 100 MG PO CAPS: 100.0000 mg | ORAL_CAPSULE | Freq: Three times a day (TID) | ORAL | 0 refills | Status: DC | PRN

## 2017-11-20 MED ORDER — CEFTRIAXONE SODIUM 1 G IJ SOLR
1.0000 g | Freq: Once | INTRAMUSCULAR | Status: AC
  Administered 2017-11-20: 1 g via INTRAMUSCULAR

## 2017-11-20 NOTE — Addendum Note (Signed)
Addended by: Tommie Raymond on: 11/20/2017 09:25 AM   Modules accepted: Orders

## 2017-11-20 NOTE — Progress Notes (Signed)
Name: Latoya Jackson   MRN: [id number]    DOB: Sep 21, 1960   Date:11/20/2017       Progress Note  Subjective  Chief Complaint  Chief Complaint  Patient presents with  . Sinus Problem  . Headache  . Cough  . Fever    HPI  Fever/Cough: symptoms started 4 days ago, initially with nasal congestion and fatigue, following day spiked a fever of 102, chills and headache. Post-nasal drainage, followed by a cough. Worse symptoms now is cough that is productive that is yellow in color, no wheezing or SOB. She quit smoking. Husband is not sick, co-workers also not sick, but she works with the public. She had flu vaccine this season. She continues to have facial pressure. No rashes, no sore throat. Appetite has been good. No change in bowel movements.   Patient Active Problem List   Diagnosis Date Noted  . Pre-diabetes 09/23/2016  . History of hysterectomy 09/17/2016  . Obesity (BMI 30.0-34.9) 02/02/2015  . Benign essential HTN 01/19/2015  . Dyslipidemia 01/19/2015  . 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 Gastro-esophageal reflux disease without esophagitis 01/19/2015  . Hive 01/19/2015  . Blood glucose elevated 01/19/2015  . B12 deficiency 01/19/2015  . Perennial allergic rhinitis 01/19/2015  . History of Helicobacter pylori infection 01/19/2015  . 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 AS (sickle cell trait) (HCC) 01/19/2015  . Tobacco use 01/19/2015  . Vitamin D deficiency 01/19/2015  . Solitary cyst of breast     Social History   Tobacco Use  . Smoking status: Former Smoker    Packs/day: 0.15    Years: 11.00    Pack years: 1.65    Types: Cigarettes    Start date: 02/01/2005    Last attempt to quit: 11/14/2015    Years since quitting: 2.0  . Smokeless tobacco: Never Used  Substance Use Topics  . Alcohol use: Yes    Alcohol/week: 0.6 - 1.8 oz    Types: 1 - 3 Standard drinks or equivalent per week    Comment: 1-3 drinks per week     Current Outpatient Medications:  .   atorvastatin (LIPITOR) 40 MG tablet, Take 1 tablet (40 mg total) by mouth daily., Disp: 90 tablet, Rfl: 1 .  Cholecalciferol (VITAMIN D) 2000 UNITS CAPS, Take 1 capsule by mouth daily., Disp: , Rfl:  .  cloNIDine (CATAPRES) 0.1 MG tablet, Take 1 tablet (0.1 mg total) by mouth at bedtime., Disp: 90 tablet, Rfl: 1 .  Cyanocobalamin (B-12) 1000 MCG SUBL, Place 1 tablet under the tongue daily., Disp: 90 each, Rfl: 1 .  lisinopril-hydrochlorothiazide (PRINZIDE,ZESTORETIC) 20-12.5 MG tablet, Take 1 tablet by mouth daily., Disp: 90 tablet, Rfl: 1 .  loratadine (CLARITIN) 10 MG tablet, TAKE 1 TABLET BY MOUTH TWICE DAILY, Disp: 90 tablet, Rfl: 3 .  triamcinolone cream (KENALOG) 0.1 %, Apply 1 g topically 2 (two) times daily., Disp: , Rfl: 0  Allergies  Allergen Reactions  . Pollen Extract   . Tree Extract     ROS  Constitutional: Negative for fever or weight change.  Respiratory: Positive  for cough but no shortness of breath.   Cardiovascular: Negative for chest pain or palpitations.  Gastrointestinal: Negative for abdominal pain, no bowel changes.  Musculoskeletal: Negative for gait problem or joint swelling.  Skin: Negative for rash.  Neurological: Negative for dizziness , positive for  headache.  No other specific complaints in a complete review of systems (except as listed in HPI above).  Objective  Vitals:  11/20/17 0827  BP: 130/90  Pulse: 88  Resp: 16  Temp: 98.7 F (37.1 C)  TempSrc: Oral  SpO2: 98%  Weight: 183 lb 14.4 oz (83.4 kg)  Height:  (1.651 m)    Body mass index is 30.6 kg/m.    Physical Exam   Constitutional: Patient appears well-developed and well-nourished. Obese No distress.  HEENT: head atraumatic, normocephalic, pupils equal and reactive to light, ears normal bilaterally,  neck supple, throat within normal limits Cardiovascular: Normal rate, regular rhythm and normal heart sounds.  No murmur heard. No BLE edema. Pulmonary/Chest: Effort normal ,  mild scattered rhonchi.  No respiratory distress. Abdominal: Soft.  There is no tenderness. Psychiatric: Patient has a normal mood and affect. behavior is normal. Judgment and thought content normal.  Recent Results (from the past 2160 hour(s))  TSH     Status: None   Collection Time: 10/04/17  9:53 AM  Result Value Ref Range   TSH 1.47 0.40 - 4.50 mIU/L  CBC with Differential/Platelet     Status: None   Collection Time: 10/04/17  9:53 AM  Result Value Ref Range   WBC 6.1 3.8 - 10.8 Thousand/uL   RBC 4.39 3.80 - 5.10 Million/uL   Hemoglobin 13.6 11.7 - 15.5 g/dL   HCT 16.1 09.6 - 04.5 %   MCV 90.9 80.0 - 100.0 fL   MCH 31.0 27.0 - 33.0 pg   MCHC 34.1 32.0 - 36.0 g/dL   RDW 40.9 81.1 - 91.4 %   Platelets 378 140 - 400 Thousand/uL   MPV 10.5 7.5 - 12.5 fL   Neutro Abs 2,959 1,500 - 7,800 cells/uL   Lymphs Abs 2,495 850 - 3,900 cells/uL   WBC mixed population 378 200 - 950 cells/uL   Eosinophils Absolute 238 15 - 500 cells/uL   Basophils Absolute 31 0 - 200 cells/uL   Neutrophils Relative % 48.5 %   Total Lymphocyte 40.9 %   Monocytes Relative 6.2 %   Eosinophils Relative 3.9 %   Basophils Relative 0.5 %  COMPLETE METABOLIC PANEL WITH GFR     Status: None   Collection Time: 10/04/17  9:53 AM  Result Value Ref Range   Glucose, Bld 91 65 - 99 mg/dL    Comment: .            Fasting reference interval .    BUN 11 7 - 25 mg/dL   Creat 7.82 9.56 - 2.13 mg/dL    Comment: For patients >54 years of age, the reference limit for Creatinine is approximately 13% higher for people identified as African-American. .    GFR, Est Non African American 97 > OR = 60 mL/min/1.53m2   GFR, Est African American 112 > OR = 60 mL/min/1.59m2   BUN/Creatinine Ratio NOT APPLICABLE 6 - 22 (calc)   Sodium 140 135 - 146 mmol/L   Potassium 4.2 3.5 - 5.3 mmol/L   Chloride 104 98 - 110 mmol/L   CO2 27 20 - 32 mmol/L   Calcium 9.9 8.6 - 10.4 mg/dL   Total Protein 7.6 6.1 - 8.1 g/dL   Albumin 4.8 3.6  - 5.1 g/dL   Globulin 2.8 1.9 - 3.7 g/dL (calc)   AG Ratio 1.7 1.0 - 2.5 (calc)   Total Bilirubin 0.5 0.2 - 1.2 mg/dL   Alkaline phosphatase (APISO) 60 33 - 130 U/L   AST 24 10 - 35 U/L   ALT 26 6 - 29 U/L  Hemoglobin A1c  Status: Abnormal   Collection Time: 10/04/17  9:53 AM  Result Value Ref Range   Hgb A1c MFr Bld 5.7 (H) <5.7 % of total Hgb    Comment: For someone without known diabetes, a hemoglobin  A1c value between 5.7% and 6.4% is consistent with prediabetes and should be confirmed with a  follow-up test. . For someone with known diabetes, a value <7% indicates that their diabetes is well controlled. A1c targets should be individualized based on duration of diabetes, age, comorbid conditions, and other considerations. . This assay result is consistent with an increased risk of diabetes. . Currently, no consensus exists regarding use of hemoglobin A1c for diagnosis of diabetes for children. .    Mean Plasma Glucose 117 (calc)   eAG (mmol/L) 6.5 (calc)  Lipid panel     Status: None   Collection Time: 10/04/17  9:53 AM  Result Value Ref Range   Cholesterol 177 <200 mg/dL   HDL 63 >16 mg/dL   Triglycerides 73 <109 mg/dL   LDL Cholesterol (Calc) 98 mg/dL (calc)    Comment: Reference range: <100 . Desirable range <100 mg/dL for primary prevention;   <70 mg/dL for patients with CHD or diabetic patients  with > or = 2 CHD risk factors. Marland Kitchen LDL-C is now calculated using the Martin-Hopkins  calculation, which is a validated novel method providing  better accuracy than the Friedewald equation in the  estimation of LDL-C.  Horald Pollen et al. Lenox Ahr. 6045;409(81): 2061-2068  (http://education.[facility].com/faq/FAQ164)    Total CHOL/HDL Ratio 2.8 <5.0 (calc)   Non-HDL Cholesterol (Calc) 114 <130 mg/dL (calc)    Comment: For patients with diabetes plus 1 major ASCVD risk  factor, treating to a non-HDL-C goal of <100 mg/dL  (LDL-C of <19 mg/dL) is considered a  therapeutic  option.   Vitamin B12     Status: None   Collection Time: 10/04/17  9:53 AM  Result Value Ref Range   Vitamin B-12 1,074 200 - 1,100 pg/mL  VITAMIN D 25 Hydroxy (Vit-D Deficiency, Fractures)     Status: None   Collection Time: 10/04/17  9:53 AM  Result Value Ref Range   Vit D, 25-Hydroxy 45 30 - 100 ng/mL    Comment: Vitamin D Status         25-OH Vitamin D: . Deficiency:                    <20 ng/mL Insufficiency:             20 - 29 ng/mL Optimal:                 > or = 30 ng/mL . For 25-OH Vitamin D testing on patients on  D2-supplementation and patients for whom quantitation  of D2 and D3 fractions is required, the QuestAssureD(TM) 25-OH VIT D, (D2,D3), LC/MS/MS is recommended: order  code 14782 (patients >85yrs). . For more information on this test, go to: http://education.[facility].com/faq/FAQ163 (This link is being provided for  informational/educational purposes only.)      Assessment & Plan  1. Cough  Discussed either flu ( but outside therapy window) or CAP and discussed watchful waiting or therapy and she chose the later.  - DG Chest 2 View; Future - CBC with Differential/Platelet - cefTRIAXone (ROCEPHIN) injection 1 g - azithromycin (ZITHROMAX) 250 MG tablet; 2 first day and daily after that  Dispense: 6 tablet; Refill: 0 - benzonatate (TESSALON) 100 MG capsule; Take 1-2 capsules (100-200 mg total) by  mouth 3 (three) times daily as needed.  Dispense: 40 capsule; Refill: 0  2. Fever, unspecified fever cause  Continue otc medication   3. AS (sickle cell trait) (HCC)  unchanged

## 2017-11-20 NOTE — Patient Instructions (Signed)

## 2017-11-22 ENCOUNTER — Ambulatory Visit
Admission: RE | Admit: 2017-11-22 | Discharge: 2017-11-22 | Disposition: A | Discharge status: Home / Self Care | Payer: 59 | Source: Ambulatory Visit | Attending: Family Medicine | Admitting: Family Medicine

## 2017-11-22 DIAGNOSIS — R05 Cough: Secondary | ICD-10-CM

## 2017-11-22 DIAGNOSIS — R509 Fever, unspecified: Secondary | ICD-10-CM | POA: Insufficient documentation

## 2017-11-22 DIAGNOSIS — R059 Cough, unspecified: Secondary | ICD-10-CM

## 2017-11-27 ENCOUNTER — Other Ambulatory Visit: Payer: Self-pay | Admitting: Family Medicine

## 2017-11-27 DIAGNOSIS — R05 Cough: Secondary | ICD-10-CM

## 2017-11-27 DIAGNOSIS — R059 Cough, unspecified: Secondary | ICD-10-CM

## 2017-11-27 NOTE — Telephone Encounter (Signed)
Patient called in for x-ray results and asks if Dr. Carlynn Purl could call in another round of antibiotics. She says "I'm still coughing and full of congestion. It's better, but I just feel like I need another round to clear it all up." I advised I would send this to the provider for her recommendation and someone from the office will call back. She asks to be called at work [phone] ext. [phone].

## 2017-11-28 MED ORDER — BENZONATATE 100 MG PO CAPS: 100.0000 mg | ORAL_CAPSULE | Freq: Three times a day (TID) | ORAL | 0 refills | Status: DC | PRN

## 2017-11-28 NOTE — Telephone Encounter (Signed)
Copied from CRM [id number]. Topic: Quick Communication - Rx Refill/Question >> Nov 27, 2017 10:02 AM Herby Abraham C wrote: Pt was seen on 11/20/17 and prescribed 2 medications. Pt says that medications are working but she would  like to have a refill on medications so to clear things up.   Medications are: azithromycin (ZITHROMAX) 250 MG tablet   and also benzonatate (TESSALON) 100 MG capsule  Pharmacy: [id number] - [place], [place] - [address] AT [address] OF [address] [phone] (Phone) [phone] (Fax)

## 2017-11-29 NOTE — Telephone Encounter (Signed)
Patient called.  Patient aware.  

## 2018-01-10 ENCOUNTER — Other Ambulatory Visit: Payer: Self-pay | Admitting: Family Medicine

## 2018-01-10 DIAGNOSIS — J3089 Other allergic rhinitis: Secondary | ICD-10-CM

## 2018-06-11 ENCOUNTER — Other Ambulatory Visit: Payer: Self-pay | Admitting: Nurse Practitioner

## 2018-06-11 DIAGNOSIS — J3089 Other allergic rhinitis: Secondary | ICD-10-CM

## 2018-07-21 ENCOUNTER — Other Ambulatory Visit: Payer: Self-pay | Admitting: Family Medicine

## 2018-07-21 DIAGNOSIS — E785 Hyperlipidemia, unspecified: Secondary | ICD-10-CM

## 2018-07-21 NOTE — Telephone Encounter (Signed)
Refill Request for Cholesterol medication. Atorvastatin 40 mg  Last physical: 10/04/2017  Lab Results  Component Value Date   CHOL 177 10/04/2017   HDL 63 10/04/2017   LDLCALC 98 10/04/2017   TRIG 73 10/04/2017   CHOLHDL 2.8 10/04/2017    Follow up: None indicated

## 2018-08-14 ENCOUNTER — Telehealth: Payer: Self-pay | Admitting: Family Medicine

## 2018-08-14 DIAGNOSIS — I10 Essential (primary) hypertension: Secondary | ICD-10-CM

## 2018-08-14 NOTE — Telephone Encounter (Signed)
Please contact patient she is due for F/U and also CPE, I can send enough until visit

## 2018-08-15 MED ORDER — LISINOPRIL-HYDROCHLOROTHIAZIDE 20-12.5 MG PO TABS: 1.0000 | ORAL_TABLET | Freq: Every day | ORAL | 0 refills | Status: DC

## 2018-08-15 NOTE — Addendum Note (Signed)
Addended by: Alba Cory F on: 08/15/2018 02:55 PM   Modules accepted: Orders

## 2018-08-15 NOTE — Telephone Encounter (Signed)
Tried calling pt, no option for VM

## 2018-08-15 NOTE — Telephone Encounter (Signed)
Patient is schedule for a OV on 08-19-2018 wirh Dr.Sowles she is would like a RX to hold her till the appt. Please advise

## 2018-08-19 ENCOUNTER — Other Ambulatory Visit (HOSPITAL_COMMUNITY)
Admission: RE | Admit: 2018-08-19 | Discharge: 2018-08-19 | Disposition: A | Discharge status: Home / Self Care | Payer: 59 | Source: Ambulatory Visit | Attending: Family Medicine | Admitting: Family Medicine

## 2018-08-19 ENCOUNTER — Ambulatory Visit (INDEPENDENT_AMBULATORY_CARE_PROVIDER_SITE_OTHER): Payer: 59 | Admitting: Family Medicine

## 2018-08-19 ENCOUNTER — Encounter: Payer: Self-pay | Admitting: Family Medicine

## 2018-08-19 VITALS — BP 148/90 | HR 76 | Temp 97.8°F | Resp 16 | Ht 65.0 in | Wt 185.3 lb

## 2018-08-19 DIAGNOSIS — I10 Essential (primary) hypertension: Secondary | ICD-10-CM | POA: Diagnosis not present

## 2018-08-19 DIAGNOSIS — R232 Flushing: Secondary | ICD-10-CM

## 2018-08-19 DIAGNOSIS — Z23 Encounter for immunization: Secondary | ICD-10-CM | POA: Diagnosis not present

## 2018-08-19 DIAGNOSIS — Z1239 Encounter for other screening for malignant neoplasm of breast: Secondary | ICD-10-CM

## 2018-08-19 DIAGNOSIS — E785 Hyperlipidemia, unspecified: Secondary | ICD-10-CM

## 2018-08-19 DIAGNOSIS — R7303 Prediabetes: Secondary | ICD-10-CM

## 2018-08-19 DIAGNOSIS — N898 Other specified noninflammatory disorders of vagina: Secondary | ICD-10-CM | POA: Diagnosis not present

## 2018-08-19 DIAGNOSIS — E7800 Pure hypercholesterolemia, unspecified: Secondary | ICD-10-CM

## 2018-08-19 DIAGNOSIS — E538 Deficiency of other specified B group vitamins: Secondary | ICD-10-CM

## 2018-08-19 DIAGNOSIS — D573 Sickle-cell trait: Secondary | ICD-10-CM

## 2018-08-19 DIAGNOSIS — E559 Vitamin D deficiency, unspecified: Secondary | ICD-10-CM

## 2018-08-19 MED ORDER — LOSARTAN POTASSIUM-HCTZ 100-25 MG PO TABS: 1.0000 | ORAL_TABLET | Freq: Every day | ORAL | 1 refills | Status: DC

## 2018-08-19 MED ORDER — ZOSTER VAC RECOMB ADJUVANTED 50 MCG/0.5ML IM SUSR: 0.5000 mL | Freq: Once | INTRAMUSCULAR | 1 refills | Status: AC

## 2018-08-19 MED ORDER — FLUCONAZOLE 150 MG PO TABS: 150.0000 mg | ORAL_TABLET | ORAL | 1 refills | Status: DC

## 2018-08-19 NOTE — Patient Instructions (Signed)
Black Cohosh

## 2018-08-19 NOTE — Addendum Note (Signed)
Addended by: Cynda Familia on: 08/19/2018 04:08 PM   Modules accepted: Orders

## 2018-08-19 NOTE — Progress Notes (Signed)
Name: Latoya MartinezSabrina H Jackson   MRN: [id number]    DOB: 10/03/1960   Date:08/19/2018       Progress Note  Subjective  Chief Complaint  Chief Complaint  Patient presents with  . Medication Refill  . Hypertension    Denies any symptoms  . Gastroesophageal Reflux  . Hyperlipidemia  . Allergic Rhinitis     Change in weather makes symptom worst only takes as needed  . Vaginal Itching    onset-2 weeks getting worst    HPI  HTN: taking medication and denies side effects, denies chest pain, dizzinessor palpitation. BP is elevated today and also on her last visit. We will adjust medication   Hyperlipidemia: taking Atorvastatin since [id number]% in the next 10 years. No side effects. No chest pain, no myalgias. She is compliant with medication.Last LDL was 98   GERD: doing well with life style modification , no heartburn or indigestion.   Hot flashes: she has noticed worsening of symptoms, night sweats and hot flashes, affecting her sleep, we gave her clonidine, but she prefers something natural. Discussed Black Cohosh . She never started clonidine   Obesity/pre-diabetes: she has gainedweight when she stopped smoking in 2017, she had  lost 14 lbs with physical activity and healthier diet, her weight is the same as last visit, even after the holidays. She is exercising 5 days a week for 37 minutes on the bike.   Vulva itching: no vaginal discharge, noticed after a shower, she changed hygiene products, advised to go back to previous hygiene products and we will try diflucan if no improvement   Patient Active Problem List   Diagnosis Date Noted  . Pre-diabetes 09/23/2016  . History of hysterectomy 09/17/2016  . Obesity (BMI 30.0-34.9) 02/02/2015  . Benign essential HTN 01/19/2015  . Dyslipidemia 01/19/2015  . 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 Gastro-esophageal reflux disease without esophagitis 01/19/2015  . Hive 01/19/2015  . Blood glucose elevated 01/19/2015  . B12  deficiency 01/19/2015  . Perennial allergic rhinitis 01/19/2015  . History of Helicobacter pylori infection 01/19/2015  . 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 AS (sickle cell trait) (HCC) 01/19/2015  . Tobacco use 01/19/2015  . Vitamin D deficiency 01/19/2015  . Solitary cyst of breast     Past Surgical History:  Procedure Laterality Date  . ABDOMINAL HYSTERECTOMY  2004  . BREAST BIOPSY Left    core- neg  . BREAST SURGERY Right 2008   Intraductal papilloma excised  . BREAST SURGERY Left 2009,2013   Benign breast tissue with dense fibrosis., Focal adenosis with a few small intraductal microcalcifications.  . COLONOSCOPY  2010    Family History  Problem Relation Age of Onset  . Hypertension Mother   . Breast cancer Neg Hx     Social History   Socioeconomic History  . Marital status: Married    Spouse name: Reuel BoomDaniel   . Number of children: 1  . Years of education: Not on file  . Highest education level: Associate degree: occupational, Scientist, product/process developmenttechnical, or vocational program  Occupational History  . Occupation: case Production designer, theatre/television/filmmanager     Comment: helps homeless to find housing [  Social Needs  . Financial resource strain: Not hard at all  . Food insecurity:    Worry: Never true    Inability: Never true  . Transportation needs:    Medical: No    Non-medical: No  Tobacco Use  . Smoking status: Former Smoker    Packs/day: 0.15    Years: 11.00  Pack years: 1.65    Types: Cigarettes    Start date: 02/01/2005    Last attempt to quit: 11/14/2015    Years since quitting: 2.7  . Smokeless tobacco: Never Used  Substance and Sexual Activity  . Alcohol use: Yes    Alcohol/week: 1.0 - 3.0 standard drinks    Types: 1 - 3 Standard drinks or equivalent per week    Comment: 1-3 drinks per week  . Drug use: No  . Sexual activity: Yes    Partners: Male  Lifestyle  . Physical activity:    Days per week: 4 days    Minutes per session: 40 min  . Stress: Not at all  Relationships  . Social  connections:    Talks on phone: More than three times a week    Gets together: Three times a week    Attends religious service: More than 4 times per year    Active member of club or organization: Yes    Attends meetings of clubs or organizations: More than 4 times per year    Relationship status: Married  . Intimate partner violence:    Fear of current or ex partner: No    Emotionally abused: No    Physically abused: No    Forced sexual activity: No  Other Topics Concern  . Not on file  Social History Narrative   Married, husband had 3 children ( one died Nov 29, 2009) and they have one child together   They have 6 grandchildren, one is in [place].      Current Outpatient Medications:  .  atorvastatin (LIPITOR) 40 MG tablet, TAKE 1 TABLET BY MOUTH DAILY, Disp: 90 tablet, Rfl: 1 .  Cholecalciferol (VITAMIN D) 2000 UNITS CAPS, Take 1 capsule by mouth daily., Disp: , Rfl:  .  Cyanocobalamin (B-12) 1000 MCG SUBL, Place 1 tablet under the tongue daily., Disp: 90 each, Rfl: 1 .  loratadine (CLARITIN) 10 MG tablet, TAKE 1 TABLET BY MOUTH TWICE DAILY, Disp: 90 tablet, Rfl: 0 .  fluconazole (DIFLUCAN) 150 MG tablet, Take 1 tablet (150 mg total) by mouth every other day., Disp: 3 tablet, Rfl: 1 .  losartan-hydrochlorothiazide (HYZAAR) 100-25 MG tablet, Take 1 tablet by mouth daily., Disp: 90 tablet, Rfl: 1 .  triamcinolone cream (KENALOG) 0.1 %, Apply 1 g topically 2 (two) times daily., Disp: , Rfl: 0 .  Zoster Vaccine Adjuvanted (SHINGRIX) injection, Inject 0.5 mLs into the muscle once for 1 dose., Disp: 0.5 mL, Rfl: 1  Allergies  Allergen Reactions  . Pollen Extract   . Tree Extract     I personally reviewed active problem list, medication list, allergies, family history, social history with the patient/caregiver today.   ROS  Constitutional: Negative for fever or weight change.  Respiratory: Negative for cough and shortness of breath.   Cardiovascular: Negative for chest pain or  palpitations.  Gastrointestinal: Negative for abdominal pain, no bowel changes.  Musculoskeletal: Negative for gait problem or joint swelling.  Skin: Negative for rash.  Neurological: Negative for dizziness or headache.  No other specific complaints in a complete review of systems (except as listed in HPI above).  Objective  Vitals:   08/19/18 0940  BP: (!) 148/90  Pulse: 76  Resp: 16  Temp: 97.8 F (36.6 C)  TempSrc: Oral  SpO2: 97%  Weight: 185 lb 4.8 oz (84.1 kg)  Height: 5\' 5"  (1.651 m)    Body mass index is 30.84 kg/m.  Physical Exam  Constitutional: Patient appears  well-developed and well-nourished. Obese  No distress.  HEENT: head atraumatic, normocephalic, pupils equal and reactive to light,  neck supple, throat within normal limits Cardiovascular: Normal rate, regular rhythm and normal heart sounds.  No murmur heard. No BLE edema. Pulmonary/Chest: Effort normal and breath sounds normal. No respiratory distress. Abdominal: Soft.  There is no tenderness. Psychiatric: Patient has a normal mood and affect. behavior is normal. Judgment and thought content normal.  PHQ2/9: Depression screen [facility] 2/9 08/19/2018 10/04/2017 09/17/2016 12/20/2015 05/05/2015  Decreased Interest 0 0 0 0 0  Down, Depressed, Hopeless 0 0 0 0 0  PHQ - 2 Score 0 0 0 0 0  Altered sleeping 0 - - - -  Tired, decreased energy 0 - - - -  Change in appetite 0 - - - -  Feeling bad or failure about yourself  0 - - - -  Trouble concentrating 0 - - - -  Moving slowly or fidgety/restless 0 - - - -  Suicidal thoughts 0 - - - -  PHQ-9 Score 0 - - - -  Difficult doing work/chores Not difficult at all - - - -    Fall Risk: Fall Risk  08/19/2018 11/20/2017 10/04/2017 04/08/2017 09/17/2016  Falls in the past year? 0 No No No No     Functional Status Survey: Is the patient deaf or have difficulty hearing?: No Does the patient have difficulty seeing, even when wearing glasses/contacts?: Yes Does the patient have  difficulty concentrating, remembering, or making decisions?: No Does the patient have difficulty walking or climbing stairs?: No Does the patient have difficulty dressing or bathing?: No Does the patient have difficulty doing errands alone such as visiting a doctor's office or shopping?: No    Assessment & Plan   1. Need for shingles vaccine  - Zoster Vaccine Adjuvanted [facility]) injection; Inject 0.5 mLs into the muscle once for 1 dose.  Dispense: 0.5 mL; Refill: 1 - Varicella-zoster vaccine IM  2. Pure hypercholesterolemia  - Lipid panel  3. Hot flashes  Try otc Black cohosh   4. Prediabetes  - Hemoglobin A1c  5. AS (sickle cell trait) (HCC)   6. Benign essential HTN  - COMPLETE METABOLIC PANEL WITH GFR - losartan-hydrochlorothiazide (HYZAAR) 100-25 MG tablet; Take 1 tablet by mouth daily.  Dispense: 90 tablet; Refill: 1  7. Dyslipidemia   8. B12 deficiency  Continue supplementation   9. Vitamin D deficiency  Continue supplementation   10. Breast cancer screening  - MM Digital Screening; Future

## 2018-08-20 LAB — COMPLETE METABOLIC PANEL WITH GFR
AG RATIO: 1.8 (calc) (ref 1.0–2.5)
ALKALINE PHOSPHATASE (APISO): 54 U/L (ref 37–153)
ALT: 31 U/L — AB (ref 6–29)
AST: 24 U/L (ref 10–35)
Albumin: 4.3 g/dL (ref 3.6–5.1)
BILIRUBIN TOTAL: 0.3 mg/dL (ref 0.2–1.2)
BUN: 9 mg/dL (ref 7–25)
CO2: 27 mmol/L (ref 20–32)
Calcium: 9.2 mg/dL (ref 8.6–10.4)
Chloride: 108 mmol/L (ref 98–110)
Creat: 0.73 mg/dL (ref 0.50–1.05)
GFR, EST AFRICAN AMERICAN: 104 mL/min/{1.73_m2} (ref >=60)
GFR, Est Non African American: 90 mL/min/{1.73_m2} (ref >=60)
GLUCOSE: 92 mg/dL (ref 65–99)
Globulin: 2.4 g/dL (calc) (ref 1.9–3.7)
Potassium: 4.3 mmol/L (ref 3.5–5.3)
Sodium: 142 mmol/L (ref 135–146)
Total Protein: 6.7 g/dL (ref 6.1–8.1)

## 2018-08-20 LAB — LIPID PANEL
Cholesterol: 156 mg/dL (ref <200)
HDL: 60 mg/dL (ref >=50)
LDL Cholesterol (Calc): 81 mg/dL (calc)
NON-HDL CHOLESTEROL (CALC): 96 mg/dL (ref <130)
TRIGLYCERIDES: 66 mg/dL (ref <150)
Total CHOL/HDL Ratio: 2.6 (calc) (ref <5.0)

## 2018-08-20 LAB — HEMOGLOBIN A1C
Hgb A1c MFr Bld: 5.6 % of total Hgb (ref <5.7)
Mean Plasma Glucose: 114 (calc)
eAG (mmol/L): 6.3 (calc)

## 2018-08-21 LAB — CERVICOVAGINAL ANCILLARY ONLY
Bacterial vaginitis: NEGATIVE
Candida vaginitis: NEGATIVE

## 2018-08-28 ENCOUNTER — Ambulatory Visit (INDEPENDENT_AMBULATORY_CARE_PROVIDER_SITE_OTHER): Payer: 59

## 2018-08-28 DIAGNOSIS — Z23 Encounter for immunization: Secondary | ICD-10-CM | POA: Diagnosis not present

## 2018-09-05 ENCOUNTER — Telehealth: Payer: Self-pay | Admitting: Family Medicine

## 2018-09-05 ENCOUNTER — Encounter: Payer: Self-pay | Admitting: Family Medicine

## 2018-09-05 ENCOUNTER — Ambulatory Visit (INDEPENDENT_AMBULATORY_CARE_PROVIDER_SITE_OTHER): Payer: 59 | Admitting: Family Medicine

## 2018-09-05 VITALS — BP 152/86 | HR 100 | Temp 99.5°F | Resp 18 | Ht 65.0 in | Wt 182.3 lb

## 2018-09-05 DIAGNOSIS — R059 Cough, unspecified: Secondary | ICD-10-CM

## 2018-09-05 DIAGNOSIS — R05 Cough: Secondary | ICD-10-CM

## 2018-09-05 DIAGNOSIS — R509 Fever, unspecified: Secondary | ICD-10-CM | POA: Diagnosis not present

## 2018-09-05 DIAGNOSIS — R52 Pain, unspecified: Secondary | ICD-10-CM | POA: Diagnosis not present

## 2018-09-05 DIAGNOSIS — H04203 Unspecified epiphora, bilateral lacrimal glands: Secondary | ICD-10-CM | POA: Diagnosis not present

## 2018-09-05 DIAGNOSIS — J101 Influenza due to other identified influenza virus with other respiratory manifestations: Secondary | ICD-10-CM

## 2018-09-05 LAB — POCT INFLUENZA A/B
Influenza A, POC: POSITIVE — AB
Influenza B, POC: NEGATIVE

## 2018-09-05 MED ORDER — BENZONATATE 100 MG PO CAPS: 100.0000 mg | ORAL_CAPSULE | Freq: Two times a day (BID) | ORAL | 0 refills | Status: DC | PRN

## 2018-09-05 MED ORDER — BALOXAVIR MARBOXIL(80 MG DOSE) 2 X 40 MG PO TBPK: 80.0000 mg | ORAL_TABLET | Freq: Every day | ORAL | 0 refills | Status: DC

## 2018-09-05 NOTE — Telephone Encounter (Signed)
Copied from CRM [id number]. Topic: General - Inquiry >> Sep 05, 2018  8:16 AM Maia Petties wrote: Reason for CRM: pt went into work yesterday but left with sore throat, body aches, congestion, fever. Pt starting taking Nyquill yesterday but not feeling any better. She believes her coworker was dx with the flu. Please advise on Zofluza or Tamiflu.  [id number] [place], [place] - [address] AT [address] [phone] (Phone) [phone] (Fax)

## 2018-09-05 NOTE — Progress Notes (Signed)
Name: Latoya Jackson   MRN: [id number]    DOB: 07/11/1961   Date:09/05/2018       Progress Note  Subjective  Chief Complaint  Chief Complaint  Patient presents with  . Generalized Body Aches    Onset-yesterday  . Fever  . Nasal Congestion    Watery eyes  . Headache  . Cough    Coughing up tan phlegm    HPI  Flu: she was at work yesterday and developed acute onset of rhinorrhea, teary eyes, body aches, cough, chlls, headache, feeling miserable and noticed a tan color phlegm also. She denies wheezing or SOB, does not know of any sick contacts. No rashes. No diarrhea or vomiting, but appetite is poor. She has been taking otc medication every 4 hours that has a antipyretic. She is not a smoker  Patient Active Problem List   Diagnosis Date Noted  . Pre-diabetes 09/23/2016  . History of hysterectomy 09/17/2016  . Obesity (BMI 30.0-34.9) 02/02/2015  . Benign essential HTN 01/19/2015  . Dyslipidemia 01/19/2015  . 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 Gastro-esophageal reflux disease without esophagitis 01/19/2015  . Hive 01/19/2015  . Blood glucose elevated 01/19/2015  . B12 deficiency 01/19/2015  . Perennial allergic rhinitis 01/19/2015  . History of Helicobacter pylori infection 01/19/2015  . 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 AS (sickle cell trait) (HCC) 01/19/2015  . Tobacco use 01/19/2015  . Vitamin D deficiency 01/19/2015  . Solitary cyst of breast     Social History   Tobacco Use  . Smoking status: Former Smoker    Packs/day: 0.15    Years: 11.00    Pack years: 1.65    Types: Cigarettes    Start date: 02/01/2005    Last attempt to quit: 11/14/2015    Years since quitting: 2.8  . Smokeless tobacco: Never Used  Substance Use Topics  . Alcohol use: Yes    Alcohol/week: 1.0 - 3.0 standard drinks    Types: 1 - 3 Standard drinks or equivalent per week    Comment: 1-3 drinks per week     Current Outpatient Medications:  .  atorvastatin (LIPITOR) 40 MG tablet,  TAKE 1 TABLET BY MOUTH DAILY, Disp: 90 tablet, Rfl: 1 .  Cholecalciferol (VITAMIN D) 2000 UNITS CAPS, Take 1 capsule by mouth daily., Disp: , Rfl:  .  Cyanocobalamin (B-12) 1000 MCG SUBL, Place 1 tablet under the tongue daily., Disp: 90 each, Rfl: 1 .  fluconazole (DIFLUCAN) 150 MG tablet, Take 1 tablet (150 mg total) by mouth every other day., Disp: 3 tablet, Rfl: 1 .  loratadine (CLARITIN) 10 MG tablet, TAKE 1 TABLET BY MOUTH TWICE DAILY, Disp: 90 tablet, Rfl: 0 .  losartan-hydrochlorothiazide (HYZAAR) 100-25 MG tablet, Take 1 tablet by mouth daily., Disp: 90 tablet, Rfl: 1 .  triamcinolone cream (KENALOG) 0.1 %, Apply 1 g topically 2 (two) times daily., Disp: , Rfl: 0 .  Baloxavir Marboxil,80 MG Dose, (XOFLUZA) 2 x 40 MG TBPK, Take 80 mg by mouth daily., Disp: 2 each, Rfl: 0 .  benzonatate (TESSALON) 100 MG capsule, Take 1-2 capsules (100-200 mg total) by mouth 2 (two) times daily as needed., Disp: 60 capsule, Rfl: 0  Allergies  Allergen Reactions  . Pollen Extract   . Tree Extract     ROS  Ten systems reviewed and is negative except as mentioned in HPI   Objective  Vitals:   09/05/18 1053  BP: (!) 152/86  Pulse: 100  Resp: 18  Temp: 99.5  F (37.5 C)  TempSrc: Oral  SpO2: 99%  Weight: 182 lb 4.8 oz (82.7 kg)  Height: 5\' 5"  (1.651 m)    Body mass index is 30.34 kg/m.    Physical Exam  Constitutional: Patient appears well-developed and well-nourished. Obese  No distress.  HEENT: head atraumatic, normocephalic, pupils equal and reactive to light, ears : normal TM,  neck supple, throat within normal limits Cardiovascular: Normal rate, regular rhythm and normal heart sounds.  No murmur heard. No BLE edema. Pulmonary/Chest: Effort normal and breath sounds normal. No respiratory distress. Abdominal: Soft.  There is no tenderness. Psychiatric: Patient has a normal mood and affect. behavior is normal. Judgment and thought content normal.  Recent Results (from the past 2160  hour(s))  Cervicovaginal ancillary only     Status: None   Collection Time: 08/19/18 12:00 AM  Result Value Ref Range   Bacterial vaginitis Negative for Bacterial Vaginitis Microorganisms     Comment: Normal Reference Range - Negative   Candida vaginitis Negative for Candida species     Comment: Normal Reference Range - Negative  COMPLETE METABOLIC PANEL WITH GFR     Status: Abnormal   Collection Time: 08/19/18 10:24 AM  Result Value Ref Range   Glucose, Bld 92 65 - 99 mg/dL    Comment: .            Fasting reference interval .    BUN 9 7 - 25 mg/dL   Creat 4.090.73 8.110.50 - 9.141.05 mg/dL    Comment: For patients >60 years of age, the reference limit for Creatinine is approximately 13% higher for people identified as African-American. .    GFR, Est Non African American 90 > OR = 60 mL/min/1.1873m2   GFR, Est African American 104 > OR = 60 mL/min/1.673m2   BUN/Creatinine Ratio NOT APPLICABLE 6 - 22 (calc)   Sodium 142 135 - 146 mmol/L   Potassium 4.3 3.5 - 5.3 mmol/L   Chloride 108 98 - 110 mmol/L   CO2 27 20 - 32 mmol/L   Calcium 9.2 8.6 - 10.4 mg/dL   Total Protein 6.7 6.1 - 8.1 g/dL   Albumin 4.3 3.6 - 5.1 g/dL   Globulin 2.4 1.9 - 3.7 g/dL (calc)   AG Ratio 1.8 1.0 - 2.5 (calc)   Total Bilirubin 0.3 0.2 - 1.2 mg/dL   Alkaline phosphatase (APISO) 54 37 - 153 U/L   AST 24 10 - 35 U/L   ALT 31 (H) 6 - 29 U/L  Lipid panel     Status: None   Collection Time: 08/19/18 10:24 AM  Result Value Ref Range   Cholesterol 156 <200 mg/dL   HDL 60 > OR = 50 mg/dL   Triglycerides 66 <782<150 mg/dL   LDL Cholesterol (Calc) 81 mg/dL (calc)    Comment: Reference range: <100 . Desirable range <100 mg/dL for primary prevention;   <70 mg/dL for patients with CHD or diabetic patients  with > or = 2 CHD risk factors. Marland Kitchen. LDL-C is now calculated using the Martin-Hopkins  calculation, which is a validated novel method providing  better accuracy than the Friedewald equation in the  estimation of LDL-C.   Horald PollenMartin SS et al. Lenox AhrJAMA. 9562;130(862013;310(19): 2061-2068  (http://education.[facility].com/faq/FAQ164)    Total CHOL/HDL Ratio 2.6 <5.0 (calc)   Non-HDL Cholesterol (Calc) 96 <578<130 mg/dL (calc)    Comment: For patients with diabetes plus 1 major ASCVD risk  factor, treating to a non-HDL-C goal of <100 mg/dL  (LDL-C of <46<70  mg/dL) is considered a therapeutic  option.   Hemoglobin A1c     Status: None   Collection Time: 08/19/18 10:24 AM  Result Value Ref Range   Hgb A1c MFr Bld 5.6 <5.7 % of total Hgb    Comment: For the purpose of screening for the presence of diabetes: . <5.7%       Consistent with the absence of diabetes 5.7-6.4%    Consistent with increased risk for diabetes             (prediabetes) > or =6.5%  Consistent with diabetes . This assay result is consistent with a decreased risk of diabetes. . Currently, no consensus exists regarding use of hemoglobin A1c for diagnosis of diabetes in children. . According to American Diabetes Association (ADA) guidelines, hemoglobin A1c <7.0% represents optimal control in non-pregnant diabetic patients. Different metrics may apply to specific patient populations.  Standards of Medical Care in Diabetes(ADA). .    Mean Plasma Glucose 114 (calc)   eAG (mmol/L) 6.3 (calc)  POCT Influenza A/B     Status: Abnormal   Collection Time: 09/05/18 10:52 AM  Result Value Ref Range   Influenza A, POC Positive (A) Negative   Influenza B, POC Negative Negative     Assessment & Plan  1. Generalized body aches  - POCT Influenza A/B  2. Fever and chills  - POCT Influenza A/B  3. Cough  - POCT Influenza A/B - benzonatate (TESSALON) 100 MG capsule; Take 1-2 capsules (100-200 mg total) by mouth 2 (two) times daily as needed.  Dispense: 60 capsule; Refill: 0  4. Watery eyes  - POCT Influenza A/B  5. Influenza A  Advised to avoid medication that causes respiratory depression such as Nyquil, drink fluids, rest and excuse for work   - Baloxavir Marboxil,80 MG Dose, (XOFLUZA) 2 x 40 MG TBPK; Take 80 mg by mouth daily.  Dispense: 2 each; Refill: 0 - benzonatate (TESSALON) 100 MG capsule; Take 1-2 capsules (100-200 mg total) by mouth 2 (two) times daily as needed.  Dispense: 60 capsule; Refill: 0

## 2018-09-05 NOTE — Telephone Encounter (Signed)
Spoke with patient this morning and scheduled to come in to see Dr. Carlynn Purl at 10:40am today.  Pt agreed to come in.

## 2018-09-25 ENCOUNTER — Other Ambulatory Visit: Payer: Self-pay | Admitting: Family Medicine

## 2018-09-25 DIAGNOSIS — Z1231 Encounter for screening mammogram for malignant neoplasm of breast: Secondary | ICD-10-CM

## 2018-10-02 ENCOUNTER — Other Ambulatory Visit: Payer: Self-pay | Admitting: Family Medicine

## 2018-10-02 DIAGNOSIS — J3089 Other allergic rhinitis: Secondary | ICD-10-CM

## 2018-10-02 NOTE — Telephone Encounter (Signed)
Refill request for general medication. Claritin to [facility].   Last office visit 09/05/2018  Follow up on 02/18/2019

## 2018-12-02 ENCOUNTER — Telehealth: Payer: Self-pay

## 2018-12-02 NOTE — Telephone Encounter (Signed)
Copied from CRM [id number]. Topic: Quick Communication - Rx Refill/Question >> Dec 01, 2018  4:26 PM Fanny Bien wrote: Pt called and stated that two bp medications are called into the pharmacy and pt would like a call back regarding on how she should take them. Please advise

## 2018-12-02 NOTE — Telephone Encounter (Signed)
Patient was notified on how to take her Losartan/HCTZ once daily in the mornings.

## 2018-12-26 ENCOUNTER — Other Ambulatory Visit: Payer: Self-pay | Admitting: Family Medicine

## 2019-02-11 ENCOUNTER — Other Ambulatory Visit: Payer: Self-pay | Admitting: Family Medicine

## 2019-02-11 DIAGNOSIS — J3089 Other allergic rhinitis: Secondary | ICD-10-CM

## 2019-02-11 MED ORDER — LORATADINE 10 MG PO TABS: 10.0000 mg | ORAL_TABLET | Freq: Every day | ORAL | 0 refills | Status: DC

## 2019-02-11 NOTE — Telephone Encounter (Signed)
Copied from [place] [id number]. Topic: Quick Communication - Rx Refill/Question >> Feb 11, 2019  3:56 PM Erick Blinks wrote: Medication: loratadine (CLARITIN) 10 MG tablet   Has the patient contacted their pharmacy? Yes.   (Agent: If no, request that the patient contact the pharmacy for the refill.) (Agent: If yes, when and what did the pharmacy advise?)  Preferred Pharmacy (with phone number or street name): [id number] [address], [place] - [address] [place] [address] Phone: [phone] Fax: [phone]    Agent: Please be advised that RX refills may take up to 3 business days. We ask that you follow-up with your pharmacy.

## 2019-02-18 ENCOUNTER — Other Ambulatory Visit: Payer: Self-pay

## 2019-02-18 ENCOUNTER — Encounter: Payer: Self-pay | Admitting: Family Medicine

## 2019-02-18 ENCOUNTER — Ambulatory Visit (INDEPENDENT_AMBULATORY_CARE_PROVIDER_SITE_OTHER): Payer: 59 | Admitting: Family Medicine

## 2019-02-18 VITALS — BP 110/50 | HR 88 | Temp 97.1°F | Resp 16 | Ht 65.0 in | Wt 184.7 lb

## 2019-02-18 DIAGNOSIS — D573 Sickle-cell trait: Secondary | ICD-10-CM

## 2019-02-18 DIAGNOSIS — Z23 Encounter for immunization: Secondary | ICD-10-CM | POA: Diagnosis not present

## 2019-02-18 DIAGNOSIS — E785 Hyperlipidemia, unspecified: Secondary | ICD-10-CM

## 2019-02-18 DIAGNOSIS — E559 Vitamin D deficiency, unspecified: Secondary | ICD-10-CM

## 2019-02-18 DIAGNOSIS — E538 Deficiency of other specified B group vitamins: Secondary | ICD-10-CM

## 2019-02-18 DIAGNOSIS — E2839 Other primary ovarian failure: Secondary | ICD-10-CM

## 2019-02-18 DIAGNOSIS — I10 Essential (primary) hypertension: Secondary | ICD-10-CM

## 2019-02-18 DIAGNOSIS — Z01419 Encounter for gynecological examination (general) (routine) without abnormal findings: Secondary | ICD-10-CM | POA: Diagnosis not present

## 2019-02-18 DIAGNOSIS — R7303 Prediabetes: Secondary | ICD-10-CM

## 2019-02-18 DIAGNOSIS — Z113 Encounter for screening for infections with a predominantly sexual mode of transmission: Secondary | ICD-10-CM | POA: Diagnosis not present

## 2019-02-18 MED ORDER — LOSARTAN POTASSIUM-HCTZ 100-12.5 MG PO TABS: 1.0000 | ORAL_TABLET | Freq: Every day | ORAL | 3 refills | Status: DC

## 2019-02-18 NOTE — Patient Instructions (Signed)

## 2019-02-18 NOTE — Progress Notes (Signed)
Name: Latoya Jackson   MRN: [id number]    DOB: 1961/02/09   Date:02/18/2019       Progress Note  Subjective  Chief Complaint  Chief Complaint  Patient presents with  . Hypertension  . Gastroesophageal Reflux  . Hyperlipidemia    HPI   Patient presents for annual CPE and follow up  HTN: taking medication and denies side effects, denies chest pain, dizzinessor palpitation. BP is towards low end of normal, no dizziness, but we will adjust dose from losartan/hctz 100/25 to 100/12.5   Hyperlipidemia: taking Atorvastatin since [id number].Last LDL went down to 81, continue medication. No myalgia  GERD:doing well with life style modification, no heartburn or indigestion, occasionally has symptoms with trigger foods , ie pizza .   Hot flashes: she is still having symptoms, does not want prescription medications, only using otc black cohosh  Obesity/pre-diabetes: she has gainedweight when she stopped smoking in 2017, she had  lost14 lbs with physical activity and healthier diet, weight still stable over the past 6 months. She is now walking and sometimes stationary bike    Diet: she is eating a balanced diet, lean meat, vegetables and fruit, drinking a lot of water.  Exercise: continue regular activity   USPSTF grade A and B recommendations  Hypertension: BP Readings from Last 3 Encounters:  02/18/19 (!) 110/50  09/05/18 (!) 152/86  08/19/18 (!) 148/90   Obesity: Wt Readings from Last 3 Encounters:  02/18/19 184 lb 11.2 oz (83.8 kg)  09/05/18 182 lb 4.8 oz (82.7 kg)  08/19/18 185 lb 4.8 oz (84.1 kg)   BMI Readings from Last 3 Encounters:  02/18/19 30.74 kg/m  09/05/18 30.34 kg/m  08/19/18 30.84 kg/m    Hep C Screening: up to date  STD testing and prevention (HIV/chl/gon/syphilis): today  Intimate partner violence: negative screen  Sexual History/Pain during Intercourse: no pain , bleeding or discharge  Menstrual History/LMP/Abnormal Bleeding: discussed  post-menopausal bleeding and need to come in  Incontinence Symptoms: no symptoms   Advanced Care Planning: A voluntary discussion about advance care planning including the explanation and discussion of advance directives.  Discussed health care proxy and Living will, and the patient was able to identify a health care proxy as husband .  Patient does have a living will at present time.   Breast cancer: due for repeat   BRCA gene screening: N/A Cervical cancer screening: s/p hysterectomy   Osteoporosis Screening:  HM Dexa Scan  Date Value Ref Range Status  09/13/2009 Normal (-0.7/0.1)  Final    Lipids:  Lab Results  Component Value Date   CHOL 156 08/19/2018   CHOL 177 10/04/2017   CHOL 138 09/20/2016   Lab Results  Component Value Date   HDL 60 08/19/2018   HDL 63 10/04/2017   HDL 60 09/20/2016   Lab Results  Component Value Date   LDLCALC 81 08/19/2018   LDLCALC 98 10/04/2017   LDLCALC 66 09/20/2016   Lab Results  Component Value Date   TRIG 66 08/19/2018   TRIG 73 10/04/2017   TRIG 62 09/20/2016   Lab Results  Component Value Date   CHOLHDL 2.6 08/19/2018   CHOLHDL 2.8 10/04/2017   CHOLHDL 2.3 09/20/2016   No results found for: LDLDIRECT  Glucose:  Glucose, Bld  Date Value Ref Range Status  08/19/2018 92 65 - 99 mg/dL Final    Comment:    .            Fasting reference interval .  10/04/2017 91 65 - 99 mg/dL Final    Comment:    .            Fasting reference interval .   09/20/2016 109 (H) 65 - 99 mg/dL Final    Skin cancer: discussed atypical lesions  Colorectal cancer: up to date  Lung cancer:   Low Dose CT Chest recommended if Age 1-80 years, 30 pack-year currently smoking OR have quit w/in 15years. Patient does not qualify.  Light smoker  ECG: 2019   Patient Active Problem List   Diagnosis Date Noted  . Pre-diabetes 09/23/2016  . History of hysterectomy 09/17/2016  . Obesity (BMI 30.0-34.9) 02/02/2015  . Benign essential HTN  01/19/2015  . Dyslipidemia 01/19/2015  . 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 Gastro-esophageal reflux disease without esophagitis 01/19/2015  . Hive 01/19/2015  . Blood glucose elevated 01/19/2015  . B12 deficiency 01/19/2015  . Perennial allergic rhinitis 01/19/2015  . History of Helicobacter pylori infection 01/19/2015  . 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 AS (sickle cell trait) ([place]) 01/19/2015  . Tobacco use 01/19/2015  . Vitamin D deficiency 01/19/2015  . Solitary cyst of breast     Past Surgical History:  Procedure Laterality Date  . ABDOMINAL HYSTERECTOMY  2004  . BREAST BIOPSY Left    core- neg  . BREAST SURGERY Right 2008   Intraductal papilloma excised  . BREAST SURGERY Left 2009,2013   Benign breast tissue with dense fibrosis., Focal adenosis with a few small intraductal microcalcifications.  . COLONOSCOPY  2010    Family History  Problem Relation Age of Onset  . Hypertension Mother   . Breast cancer Neg Hx     Social History   Socioeconomic History  . Marital status: Married    Spouse name: Quillian Quince   . Number of children: 1  . Years of education: Not on file  . Highest education level: Associate degree: occupational, Hotel manager, or vocational program  Occupational History  . Occupation: case Freight forwarder     Comment: helps homeless to find housing [  Social Needs  . Financial resource strain: Not hard at all  . Food insecurity    Worry: Never true    Inability: Never true  . Transportation needs    Medical: No    Non-medical: No  Tobacco Use  . Smoking status: Former Smoker    Packs/day: 0.15    Years: 11.00    Pack years: 1.65    Types: Cigarettes    Start date: 02/01/2005    Quit date: 11/14/2015    Years since quitting: 3.2  . Smokeless tobacco: Never Used  Substance and Sexual Activity  . Alcohol use: Yes    Alcohol/week: 1.0 - 3.0 standard drinks    Types: 1 - 3 Standard drinks or equivalent per week    Comment: 1-3 drinks per week  . Drug  use: No  . Sexual activity: Yes    Partners: Male  Lifestyle  . Physical activity    Days per week: 4 days    Minutes per session: 40 min  . Stress: Not at all  Relationships  . Social connections    Talks on phone: More than three times a week    Gets together: Three times a week    Attends religious service: More than 4 times per year    Active member of club or organization: Yes    Attends meetings of clubs or organizations: More than 4 times per year  Relationship status: Married  . Intimate partner violence    Fear of current or ex partner: No    Emotionally abused: No    Physically abused: No    Forced sexual activity: No  Other Topics Concern  . Not on file  Social History Narrative   Married, husband had 3 children ( one died 2009/09/27) and they have one child together   They have 6 grandchildren, one is in [place].      Current Outpatient Medications:  .  atorvastatin (LIPITOR) 40 MG tablet, TAKE 1 TABLET BY MOUTH DAILY, Disp: 90 tablet, Rfl: 1 .  Cholecalciferol (VITAMIN D) 2000 UNITS CAPS, Take 1 capsule by mouth daily., Disp: , Rfl:  .  Cyanocobalamin (B-12) 1000 MCG SUBL, Place 1 tablet under the tongue daily., Disp: 90 each, Rfl: 1 .  loratadine (CLARITIN) 10 MG tablet, Take 1 tablet (10 mg total) by mouth daily., Disp: 90 tablet, Rfl: 0 .  losartan-hydrochlorothiazide (HYZAAR) 100-25 MG tablet, Take 1 tablet by mouth daily., Disp: 90 tablet, Rfl: 1 .  triamcinolone cream (KENALOG) 0.1 %, Apply 1 g topically 2 (two) times daily., Disp: , Rfl: 0 .  Baloxavir Marboxil,80 MG Dose, (XOFLUZA) 2 x 40 MG TBPK, Take 80 mg by mouth daily., Disp: 2 each, Rfl: 0 .  benzonatate (TESSALON) 100 MG capsule, Take 1-2 capsules (100-200 mg total) by mouth 2 (two) times daily as needed. (Patient not taking: Reported on 02/18/2019), Disp: 60 capsule, Rfl: 0 .  fluconazole (DIFLUCAN) 150 MG tablet, Take 1 tablet (150 mg total) by mouth every other day. (Patient not taking: Reported on  02/18/2019), Disp: 3 tablet, Rfl: 1  Allergies  Allergen Reactions  . Pollen Extract   . Tree Extract      ROS  Constitutional: Negative for fever or weight change.  Respiratory: Negative for cough and shortness of breath.   Cardiovascular: Negative for chest pain or palpitations.  Gastrointestinal: Negative for abdominal pain, no bowel changes.  Musculoskeletal: Negative for gait problem or joint swelling.  Skin: Negative for rash.  Neurological: Negative for dizziness or headache.  No other specific complaints in a complete review of systems (except as listed in HPI above).  Objective  Vitals:   02/18/19 0921  BP: (!) 110/50  Pulse: 88  Resp: 16  Temp: (!) 97.1 F (36.2 C)  TempSrc: Temporal  SpO2: 98%  Weight: 184 lb 11.2 oz (83.8 kg)  Height: _0  (1.651 m)    Body mass index is 30.74 kg/m.  Physical Exam  Constitutional: Patient appears well-developed and well-nourished. No distress.  HENT: Head: Normocephalic and atraumatic. Ears: B TMs ok, no erythema or effusion; Nose: Nose normal. Mouth/Throat: Oropharynx is clear and moist. No oropharyngeal exudate.  Eyes: Conjunctivae and EOM are normal. Pupils are equal, round, and reactive to light. No scleral icterus.  Neck: Normal range of motion. Neck supple. No JVD present. No thyromegaly present.  Cardiovascular: Normal rate, regular rhythm and normal heart sounds.  No murmur heard. No BLE edema. Pulmonary/Chest: Effort normal and breath sounds normal. No respiratory distress. Abdominal: Soft. Bowel sounds are normal, no distension. There is no tenderness. no masses Breast: no lumps or masses, no nipple discharge or rashes FEMALE GENITALIA: External genitalia normal External urethra normal Vaginal atrophy  Cervix absent Bimanual exam normal without masses, scar tissue felt from previous hysterectomy - supra pubic area  RECTAL: not done Musculoskeletal: Normal range of motion, no joint effusions. No gross  deformities Neurological: he is alert  and oriented to person, place, and time. . Coordination, balance, strength, speech and gait are normal.  Skin: Skin is warm and dry. No rash noted. No erythema.  Psychiatric: Patient has a normal mood and affect. behavior is normal. Judgment and thought content normal.  PHQ2/9: Depression screen [facility] 2/9 02/18/2019 08/19/2018 10/04/2017 09/17/2016 12/20/2015  Decreased Interest 0 0 0 0 0  Down, Depressed, Hopeless 0 0 0 0 0  PHQ - 2 Score 0 0 0 0 0  Altered sleeping 0 0 - - -  Tired, decreased energy 0 0 - - -  Change in appetite 0 0 - - -  Feeling bad or failure about yourself  0 0 - - -  Trouble concentrating 0 0 - - -  Moving slowly or fidgety/restless 0 0 - - -  Suicidal thoughts 0 0 - - -  PHQ-9 Score 0 0 - - -  Difficult doing work/chores - Not difficult at all - - -    Fall Risk: Fall Risk  02/18/2019 08/19/2018 11/20/2017 10/04/2017 04/08/2017  Falls in the past year? 0 0 No No No  Number falls in past yr: 0 - - - -  Injury with Fall? 0 - - - -     Functional Status Survey: Is the patient deaf or have difficulty hearing?: No Does the patient have difficulty seeing, even when wearing glasses/contacts?: No Does the patient have difficulty concentrating, remembering, or making decisions?: No Does the patient have difficulty walking or climbing stairs?: No Does the patient have difficulty dressing or bathing?: No Does the patient have difficulty doing errands alone such as visiting a doctor's office or shopping?: No   Assessment & Plan  1. Well woman exam  - Lipid panel - COMPLETE METABOLIC PANEL WITH GFR - CBC with Differential/Platelet - Hemoglobin A1c - HIV Antibody (routine testing w rflx) - Hepatitis panel, acute - RPR  2. Routine screening for STI (sexually transmitted infection)  - HIV Antibody (routine testing w rflx) - Hepatitis panel, acute - RPR  3. Prediabetes  Recheck A1C  4. Benign essential HTN  -  losartan-hydrochlorothiazide (HYZAAR) 100-12.5 MG tablet; Take 1 tablet by mouth daily.  Dispense: 90 tablet; Refill: 3  5. B12 deficiency  Continue supplementation   6. Vitamin D deficiency  On supplementation   7. Dyslipidemia  Recheck level  8. AS (sickle cell trait) (HCC)   9. Ovarian failure  - DG Bone Density; Future  10. Need for shingles vaccine  - Varicella-zoster vaccine IM   -USPSTF grade A and B recommendations reviewed with patient; age-appropriate recommendations, preventive care, screening tests, etc discussed and encouraged; healthy living encouraged; see AVS for patient education given to patient -Discussed importance of 150 minutes of physical activity weekly, eat two servings of fish weekly, eat one serving of tree nuts ( cashews, pistachios, pecans, almonds.Marland Kitchen) every other day, eat 6 servings of fruit/vegetables daily and drink plenty of water and avoid sweet beverages.

## 2019-02-19 LAB — COMPLETE METABOLIC PANEL WITH GFR
AG Ratio: 1.7 (calc) (ref 1.0–2.5)
ALT: 47 U/L — ABNORMAL HIGH (ref 6–29)
AST: 27 U/L (ref 10–35)
Albumin: 4.8 g/dL (ref 3.6–5.1)
Alkaline phosphatase (APISO): 60 U/L (ref 37–153)
BUN: 16 mg/dL (ref 7–25)
CO2: 26 mmol/L (ref 20–32)
Calcium: 10.2 mg/dL (ref 8.6–10.4)
Chloride: 104 mmol/L (ref 98–110)
Creat: 0.73 mg/dL (ref 0.50–0.99)
GFR, Est African American: 104 mL/min/{1.73_m2} (ref >=60)
GFR, Est Non African American: 90 mL/min/{1.73_m2} (ref >=60)
Globulin: 2.9 g/dL (calc) (ref 1.9–3.7)
Glucose, Bld: 99 mg/dL (ref 65–99)
Potassium: 4 mmol/L (ref 3.5–5.3)
Sodium: 138 mmol/L (ref 135–146)
Total Bilirubin: 0.6 mg/dL (ref 0.2–1.2)
Total Protein: 7.7 g/dL (ref 6.1–8.1)

## 2019-02-19 LAB — HEPATITIS PANEL, ACUTE
Hep A IgM: NONREACTIVE
Hep B C IgM: NONREACTIVE
Hepatitis B Surface Ag: NONREACTIVE
Hepatitis C Ab: NONREACTIVE
SIGNAL TO CUT-OFF: 0.01 (ref <1.00)

## 2019-02-19 LAB — CBC WITH DIFFERENTIAL/PLATELET
Absolute Monocytes: 498 cells/uL (ref 200–950)
Basophils Absolute: 30 cells/uL (ref 0–200)
Basophils Relative: 0.5 %
Eosinophils Absolute: 150 cells/uL (ref 15–500)
Eosinophils Relative: 2.5 %
HCT: 39.8 % (ref 35.0–45.0)
Hemoglobin: 13.4 g/dL (ref 11.7–15.5)
Lymphs Abs: 2136 cells/uL (ref 850–3900)
MCH: 31.5 pg (ref 27.0–33.0)
MCHC: 33.7 g/dL (ref 32.0–36.0)
MCV: 93.6 fL (ref 80.0–100.0)
MPV: 10.6 fL (ref 7.5–12.5)
Monocytes Relative: 8.3 %
Neutro Abs: 3186 cells/uL (ref 1500–7800)
Neutrophils Relative %: 53.1 %
Platelets: 348 10*3/uL (ref 140–400)
RBC: 4.25 10*6/uL (ref 3.80–5.10)
RDW: 12.8 % (ref 11.0–15.0)
Total Lymphocyte: 35.6 %
WBC: 6 10*3/uL (ref 3.8–10.8)

## 2019-02-19 LAB — RPR: RPR Ser Ql: NONREACTIVE

## 2019-02-19 LAB — LIPID PANEL
Cholesterol: 186 mg/dL (ref <200)
HDL: 66 mg/dL (ref >=50)
LDL Cholesterol (Calc): 103 mg/dL (calc) — ABNORMAL HIGH
Non-HDL Cholesterol (Calc): 120 mg/dL (calc) (ref <130)
Total CHOL/HDL Ratio: 2.8 (calc) (ref <5.0)
Triglycerides: 80 mg/dL (ref <150)

## 2019-02-19 LAB — HEMOGLOBIN A1C
Hgb A1c MFr Bld: 5.6 % of total Hgb (ref <5.7)
Mean Plasma Glucose: 114 (calc)
eAG (mmol/L): 6.3 (calc)

## 2019-02-19 LAB — HIV ANTIBODY (ROUTINE TESTING W REFLEX): HIV 1&2 Ab, 4th Generation: NONREACTIVE

## 2019-02-24 ENCOUNTER — Other Ambulatory Visit: Payer: Self-pay | Admitting: Family Medicine

## 2019-02-24 ENCOUNTER — Other Ambulatory Visit: Payer: Self-pay

## 2019-02-24 DIAGNOSIS — I10 Essential (primary) hypertension: Secondary | ICD-10-CM

## 2019-02-24 DIAGNOSIS — E785 Hyperlipidemia, unspecified: Secondary | ICD-10-CM

## 2019-02-24 NOTE — Telephone Encounter (Signed)
She only need Atorvastatin.

## 2019-03-18 ENCOUNTER — Ambulatory Visit
Admission: RE | Admit: 2019-03-18 | Discharge: 2019-03-18 | Disposition: A | Discharge status: Home / Self Care | Payer: 59 | Source: Ambulatory Visit | Attending: Family Medicine | Admitting: Family Medicine

## 2019-03-18 ENCOUNTER — Other Ambulatory Visit: Payer: Self-pay

## 2019-03-18 DIAGNOSIS — E2839 Other primary ovarian failure: Secondary | ICD-10-CM

## 2019-03-18 DIAGNOSIS — Z1231 Encounter for screening mammogram for malignant neoplasm of breast: Secondary | ICD-10-CM

## 2019-04-20 ENCOUNTER — Other Ambulatory Visit: Payer: Self-pay | Admitting: Family Medicine

## 2019-04-20 DIAGNOSIS — I10 Essential (primary) hypertension: Secondary | ICD-10-CM

## 2019-04-20 DIAGNOSIS — J3089 Other allergic rhinitis: Secondary | ICD-10-CM

## 2019-04-20 DIAGNOSIS — E785 Hyperlipidemia, unspecified: Secondary | ICD-10-CM

## 2019-04-20 MED ORDER — ATORVASTATIN CALCIUM 40 MG PO TABS: 40.0000 mg | ORAL_TABLET | Freq: Every day | ORAL | 0 refills | Status: DC

## 2019-04-20 MED ORDER — LORATADINE 10 MG PO TABS: 10.0000 mg | ORAL_TABLET | Freq: Every day | ORAL | 0 refills | Status: DC

## 2019-04-20 MED ORDER — LOSARTAN POTASSIUM-HCTZ 100-12.5 MG PO TABS: 1.0000 | ORAL_TABLET | Freq: Every day | ORAL | 0 refills | Status: DC

## 2019-04-20 NOTE — Telephone Encounter (Signed)
Medication Refill - Medication:  atorvastatin (LIPITOR) 40 MG tablet [[id number]]  loratadine (CLARITIN) 10 MG tablet [[id number]]   losartan-hydrochlorothiazide (HYZAAR) 100-12.5 MG tablet  Pt is needing a 90 day supply , she is moving at of state and lost her job so she needs enough meds to get another Dr   Has the patient contacted their pharmacy? No. (Agent: If no, request that the patient contact the pharmacy for the refill.) (Agent: If yes, when and what did the pharmacy advise?)  Preferred Pharmacy (with phone number or street name): [id number] [address], [place] [phone] (Phone)     Agent: Please be advised that RX refills may take up to 3 business days. We ask that you follow-up with your pharmacy.

## 2019-04-22 ENCOUNTER — Other Ambulatory Visit: Payer: Self-pay

## 2019-04-22 ENCOUNTER — Ambulatory Visit (INDEPENDENT_AMBULATORY_CARE_PROVIDER_SITE_OTHER): Payer: 59

## 2019-04-22 DIAGNOSIS — Z23 Encounter for immunization: Secondary | ICD-10-CM | POA: Diagnosis not present

## 2019-06-03 ENCOUNTER — Telehealth: Payer: Self-pay | Admitting: Family Medicine

## 2019-06-03 DIAGNOSIS — E785 Hyperlipidemia, unspecified: Secondary | ICD-10-CM

## 2019-06-03 DIAGNOSIS — I10 Essential (primary) hypertension: Secondary | ICD-10-CM

## 2019-06-03 DIAGNOSIS — J3089 Other allergic rhinitis: Secondary | ICD-10-CM

## 2019-06-03 NOTE — Telephone Encounter (Signed)
Copied from [place] [id number]. Topic: Quick Communication - Rx Refill/Question >> Jun 03, 2019 12:16 PM Erick Blinks wrote: Requesting for all her medications to be sent over to [facility]  Phone: [phone] Fax: [phone]

## 2019-06-04 MED ORDER — ATORVASTATIN CALCIUM 40 MG PO TABS: 40.0000 mg | ORAL_TABLET | Freq: Every day | ORAL | 0 refills | Status: DC

## 2019-06-04 MED ORDER — LORATADINE 10 MG PO TABS: 10.0000 mg | ORAL_TABLET | Freq: Every day | ORAL | 0 refills | Status: DC

## 2019-06-04 MED ORDER — LOSARTAN POTASSIUM-HCTZ 100-12.5 MG PO TABS: 1.0000 | ORAL_TABLET | Freq: Every day | ORAL | 0 refills | Status: DC

## 2019-08-21 ENCOUNTER — Ambulatory Visit: Payer: 59 | Admitting: Family Medicine

## 2019-10-19 ENCOUNTER — Other Ambulatory Visit (HOSPITAL_COMMUNITY)
Admission: RE | Admit: 2019-10-19 | Discharge: 2019-10-19 | Disposition: A | Discharge status: Home / Self Care | Source: Ambulatory Visit | Attending: Family Medicine | Admitting: Family Medicine

## 2019-10-19 ENCOUNTER — Ambulatory Visit (INDEPENDENT_AMBULATORY_CARE_PROVIDER_SITE_OTHER): Admitting: Family Medicine

## 2019-10-19 ENCOUNTER — Other Ambulatory Visit: Payer: Self-pay

## 2019-10-19 ENCOUNTER — Encounter: Payer: Self-pay | Admitting: Family Medicine

## 2019-10-19 VITALS — BP 140/78 | HR 86 | Temp 96.8°F | Ht 65.0 in | Wt 184.0 lb

## 2019-10-19 DIAGNOSIS — N898 Other specified noninflammatory disorders of vagina: Secondary | ICD-10-CM

## 2019-10-19 DIAGNOSIS — R7303 Prediabetes: Secondary | ICD-10-CM

## 2019-10-19 DIAGNOSIS — J3089 Other allergic rhinitis: Secondary | ICD-10-CM

## 2019-10-19 DIAGNOSIS — D573 Sickle-cell trait: Secondary | ICD-10-CM

## 2019-10-19 DIAGNOSIS — E559 Vitamin D deficiency, unspecified: Secondary | ICD-10-CM

## 2019-10-19 DIAGNOSIS — E538 Deficiency of other specified B group vitamins: Secondary | ICD-10-CM

## 2019-10-19 DIAGNOSIS — I10 Essential (primary) hypertension: Secondary | ICD-10-CM

## 2019-10-19 DIAGNOSIS — E785 Hyperlipidemia, unspecified: Secondary | ICD-10-CM

## 2019-10-19 MED ORDER — ATORVASTATIN CALCIUM 40 MG PO TABS: 40.0000 mg | ORAL_TABLET | Freq: Every day | ORAL | 1 refills | Status: AC

## 2019-10-19 MED ORDER — LOSARTAN POTASSIUM-HCTZ 100-12.5 MG PO TABS: 1.0000 | ORAL_TABLET | Freq: Every day | ORAL | 1 refills | Status: AC

## 2019-10-19 MED ORDER — LORATADINE 10 MG PO TABS: 10.0000 mg | ORAL_TABLET | Freq: Every day | ORAL | 1 refills | Status: AC

## 2019-10-19 NOTE — Progress Notes (Signed)
Name: Latoya Jackson   MRN: [id number]    DOB: 1960-10-08   Date:10/19/2019       Progress Note  Subjective  Chief Complaint  Chief Complaint  Patient presents with  . Follow-up    6 month follow up  . Medication Refill    HPI  HTN: taking medication and denies side effects, denies chest pain, dizzinessor palpitation. BP is borderline, we adjusted her dose of losartan hctz from 100/25 to 100/12.5 on her last visit and bp today is 140/78, at home it is in the 130's, we will continue current dose for now   Hyperlipidemia: taking Atorvastatin since [id number].LDL went down to 81 but went up to 103 last visit , she is compliant with medication and has resumed a healthier diet   GERD:doing well with life style modification, no heartburn or indigestion, occasionally has symptoms with trigger foods , ie pizza . Doing well at this time  Hot flashes: she is still having symptoms, does not want prescription medications, stopped using black cohosh, she is not working now and is able to change clothes  Obesity/pre-diabetes: she has gainedweight when she stopped smoking in 2017,she hadlost14 lbs with physical activity and healthier diet, weight still stable over the past 6 months. She is now walking and sometimes stationary bike    Vaginal itching: same sexual partner, she has itching on introitus at night and when she wipes and also a vaginal odor  " it does not smell clean" , no discharge on her underwear or rashes.   Patient Active Problem List   Diagnosis Date Noted  . Pre-diabetes 09/23/2016  . History of hysterectomy 09/17/2016  . Obesity (BMI 30.0-34.9) 02/02/2015  . Benign essential HTN 01/19/2015  . Dyslipidemia 01/19/2015  . 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 Gastro-esophageal reflux disease without esophagitis 01/19/2015  . Hive 01/19/2015  . Blood glucose elevated 01/19/2015  . B12 deficiency 01/19/2015  . Perennial allergic rhinitis 01/19/2015  . History of  Helicobacter pylori infection 01/19/2015  . 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 AS (sickle cell trait) (HCC) 01/19/2015  . Tobacco use 01/19/2015  . Vitamin D deficiency 01/19/2015  . Solitary cyst of breast     Past Surgical History:  Procedure Laterality Date  . ABDOMINAL HYSTERECTOMY  2004  . BREAST BIOPSY Left    core- neg  . BREAST SURGERY Right 2008   Intraductal papilloma excised  . BREAST SURGERY Left 2009,2013   Benign breast tissue with dense fibrosis., Focal adenosis with a few small intraductal microcalcifications.  . COLONOSCOPY  2010    Family History  Problem Relation Age of Onset  . Hypertension Mother   . Breast cancer Neg Hx     Social History   Tobacco Use  . Smoking status: Former Smoker    Packs/day: 0.15    Years: 11.00    Pack years: 1.65    Types: Cigarettes    Start date: 02/01/2005    Quit date: 11/14/2015    Years since quitting: 3.9  . Smokeless tobacco: Never Used  Substance Use Topics  . Alcohol use: Yes    Alcohol/week: 1.0 - 3.0 standard drinks    Types: 1 - 3 Standard drinks or equivalent per week    Comment: 1-3 drinks per week     Current Outpatient Medications:  .  atorvastatin (LIPITOR) 40 MG tablet, Take 1 tablet (40 mg total) by mouth daily., Disp: 90 tablet, Rfl: 0 .  Cholecalciferol (VITAMIN D) 2000 UNITS CAPS, Take  1 capsule by mouth daily., Disp: , Rfl:  .  Cyanocobalamin (B-12) 1000 MCG SUBL, Place 1 tablet under the tongue daily., Disp: 90 each, Rfl: 1 .  loratadine (CLARITIN) 10 MG tablet, Take 1 tablet (10 mg total) by mouth daily., Disp: 90 tablet, Rfl: 0 .  losartan-hydrochlorothiazide (HYZAAR) 100-12.5 MG tablet, Take 1 tablet by mouth daily., Disp: 90 tablet, Rfl: 0 .  triamcinolone cream (KENALOG) 0.1 %, Apply 1 g topically 2 (two) times daily., Disp: , Rfl: 0  Allergies  Allergen Reactions  . Pollen Extract   . Tree Extract     I personally reviewed active problem list, medication list, allergies, family  history, social history, health maintenance with the patient/caregiver today.   ROS  Constitutional: Negative for fever or weight change.  Respiratory: Negative for cough and shortness of breath.   Cardiovascular: Negative for chest pain or palpitations.  Gastrointestinal: Negative for abdominal pain, no bowel changes.  Musculoskeletal: Negative for gait problem or joint swelling.  Skin: Negative for rash.  Neurological: Negative for dizziness or headache.  No other specific complaints in a complete review of systems (except as listed in HPI above).  Objective  Vitals:   10/19/19 0930  BP: 140/78  Pulse: 86  Temp: (!) 96.8 F (36 C)  SpO2: 97%  Weight: 184 lb (83.5 kg)  Height: 5\' 5"  (1.651 m)    Body mass index is 30.62 kg/m.  Physical Exam  Constitutional: Patient appears well-developed and well-nourished. Obese  No distress.  HEENT: head atraumatic, normocephalic, pupils equal and reactive to light Cardiovascular: Normal rate, regular rhythm and normal heart sounds.  No murmur heard. No BLE edema. Pulmonary/Chest: Effort normal and breath sounds normal. No respiratory distress. Abdominal: Soft.  There is no tenderness. Psychiatric: Patient has a normal mood and affect. behavior is normal. Judgment and thought content normal.  PHQ2/9: Depression screen [facility] 2/9 10/19/2019 02/18/2019 08/19/2018 10/04/2017 09/17/2016  Decreased Interest 0 0 0 0 0  Down, Depressed, Hopeless 0 0 0 0 0  PHQ - 2 Score 0 0 0 0 0  Altered sleeping 0 0 0 - -  Tired, decreased energy 0 0 0 - -  Change in appetite 0 0 0 - -  Feeling bad or failure about yourself  0 0 0 - -  Trouble concentrating 0 0 0 - -  Moving slowly or fidgety/restless 0 0 0 - -  Suicidal thoughts 0 0 0 - -  PHQ-9 Score 0 0 0 - -  Difficult doing work/chores Not difficult at all - Not difficult at all - -    phq 9 is negative   Fall Risk: Fall Risk  10/19/2019 02/18/2019 08/19/2018 11/20/2017 10/04/2017  Falls in the past year? 0 0  0 No No  Number falls in past yr: 0 0 - - -  Injury with Fall? 0 0 - - -     Functional Status Survey: Is the patient deaf or have difficulty hearing?: No Does the patient have difficulty seeing, even when wearing glasses/contacts?: No Does the patient have difficulty concentrating, remembering, or making decisions?: No Does the patient have difficulty walking or climbing stairs?: No Does the patient have difficulty dressing or bathing?: No Does the patient have difficulty doing errands alone such as visiting a doctor's office or shopping?: No    Assessment & Plan  1. Vaginal pruritus  - Cervicovaginal ancillary only  2. Benign essential HTN  - losartan-hydrochlorothiazide (HYZAAR) 100-12.5 MG tablet; Take 1 tablet  by mouth daily.  Dispense: 90 tablet; Refill: 1  3. Dyslipidemia  - atorvastatin (LIPITOR) 40 MG tablet; Take 1 tablet (40 mg total) by mouth daily.  Dispense: 90 tablet; Refill: 1  4. Perennial allergic rhinitis  - loratadine (CLARITIN) 10 MG tablet; Take 1 tablet (10 mg total) by mouth daily.  Dispense: 90 tablet; Refill: 1  5. Prediabetes  Continue a healthier diet   6. B12 deficiency  Continue supplementation   7. Vitamin D deficiency  Continue supplementation   8. AS (sickle cell trait) (HCC)  Unchanged

## 2019-10-20 ENCOUNTER — Other Ambulatory Visit: Payer: Self-pay | Admitting: Family Medicine

## 2019-10-20 DIAGNOSIS — B9689 Other specified bacterial agents as the cause of diseases classified elsewhere: Secondary | ICD-10-CM

## 2019-10-20 LAB — CERVICOVAGINAL ANCILLARY ONLY
Bacterial Vaginitis (gardnerella): POSITIVE — AB
Candida Glabrata: NEGATIVE
Candida Vaginitis: NEGATIVE
Chlamydia: NEGATIVE
Comment: NEGATIVE
Comment: NEGATIVE
Comment: NEGATIVE
Comment: NEGATIVE
Comment: NEGATIVE
Comment: NORMAL
Neisseria Gonorrhea: NEGATIVE
Trichomonas: NEGATIVE

## 2019-10-20 MED ORDER — METRONIDAZOLE 500 MG PO TABS: 500.0000 mg | ORAL_TABLET | Freq: Two times a day (BID) | ORAL | 0 refills | Status: AC

## 2019-11-02 ENCOUNTER — Telehealth: Payer: Self-pay | Admitting: Family Medicine

## 2019-11-02 ENCOUNTER — Other Ambulatory Visit: Payer: Self-pay | Admitting: Family Medicine

## 2019-11-02 DIAGNOSIS — B379 Candidiasis, unspecified: Secondary | ICD-10-CM

## 2019-11-02 MED ORDER — FLUCONAZOLE 150 MG PO TABS: 150.0000 mg | ORAL_TABLET | ORAL | 0 refills | Status: AC

## 2019-11-02 NOTE — Telephone Encounter (Signed)
Patient is calling to report that she had two more days of the antibotic. However, she is having some itching on the outside of her vagina. Can Dr. Carlynn Purl can in an ointment for the patient? Preferred [facility]. Cb- [phone]

## 2019-11-17 ENCOUNTER — Telehealth: Payer: Self-pay

## 2019-11-17 DIAGNOSIS — N760 Acute vaginitis: Secondary | ICD-10-CM

## 2019-11-17 NOTE — Addendum Note (Signed)
Addended by: Alba Cory F on: 11/17/2019 09:14 PM   Modules accepted: Orders

## 2019-11-17 NOTE — Telephone Encounter (Signed)
Copied from CRM [id number]. Topic: General - Call Back - No Documentation >> Nov 17, 2019 12:23 PM Randol Kern wrote: Reason for CRM: Pt called regarding her current bacterial vaginosis symptoms. Pt is requesting a call back from clinic with advice because she says the medication is not helping and symptoms have worsened. Please advise  Best contact: [phone]

## 2019-11-18 NOTE — Addendum Note (Signed)
Addended by: Tommie Raymond on: 11/18/2019 08:59 AM   Modules accepted: Orders

## 2019-11-18 NOTE — Telephone Encounter (Signed)
Pt states she has moved to [facility], and does nit know any GYN in her area.  But she is going to look into it, ask around, and see if she can't make her own new pt appt.  If she needs anything further, she will call back.

## 2019-11-18 NOTE — Addendum Note (Signed)
Addended by: Alba Cory F on: 11/18/2019 02:26 PM   Modules accepted: Orders

## 2019-11-18 NOTE — Addendum Note (Signed)
Addended by: Alba Cory F on: 11/18/2019 01:16 PM   Modules accepted: Orders

## 2019-11-18 NOTE — Telephone Encounter (Signed)
Patient did not answer when I called. Then she called back and said she does not know of any GYN in [place]. Please read note below.

## 2019-11-19 NOTE — Addendum Note (Signed)
Addended by: Tommie Raymond on: 11/19/2019 08:23 AM   Modules accepted: Orders

## 2019-11-19 NOTE — Addendum Note (Signed)
Addended by: Alba Cory F on: 11/19/2019 12:37 PM   Modules accepted: Orders

## 2020-04-14 ENCOUNTER — Ambulatory Visit: Admitting: Family Medicine

## 2020-08-08 ENCOUNTER — Ambulatory Visit: Admitting: Family Medicine

## 2021-08-01 IMAGING — MG MM DIGITAL SCREENING BILAT W/ TOMO W/ CAD
8 series · 8 of 24 positions shown · non-contrast
Comparison: Previous exam(s).

CLINICAL DATA: Screening.

EXAM:
DIGITAL SCREENING BILATERAL MAMMOGRAM WITH TOMO AND CAD

[L MLO synth-2D]
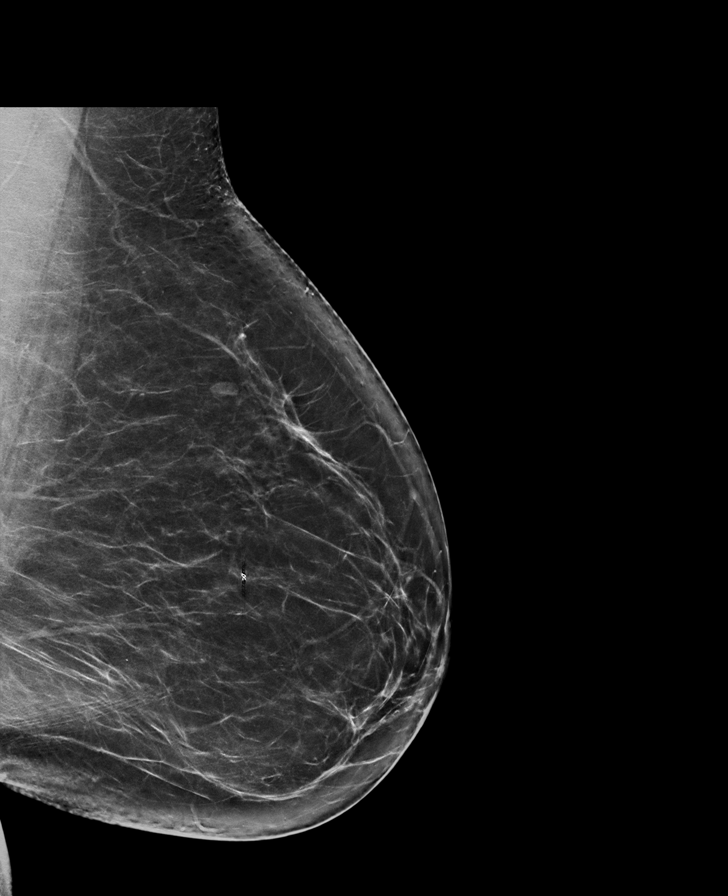

[R CC synth-2D]
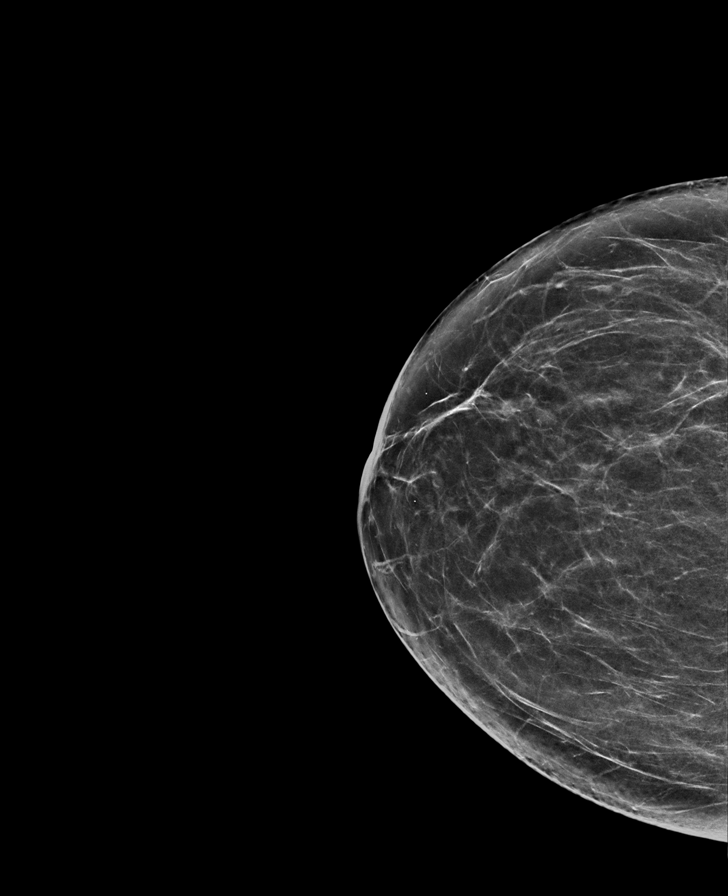

[L CC synth-2D]
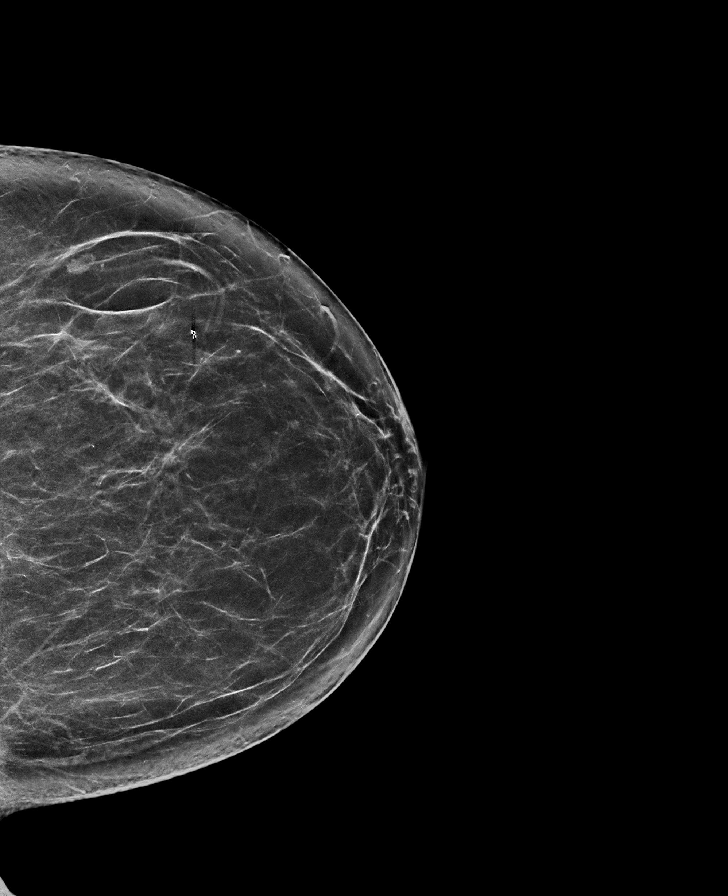

[R MLO synth-2D]
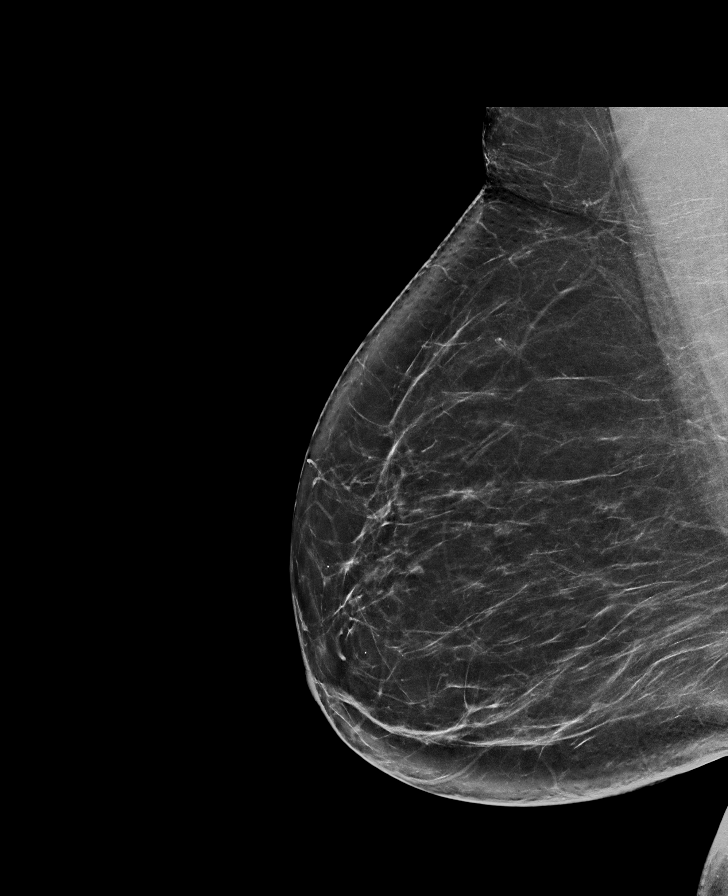

[L MLO tomo · tomo slice 43/86.0]
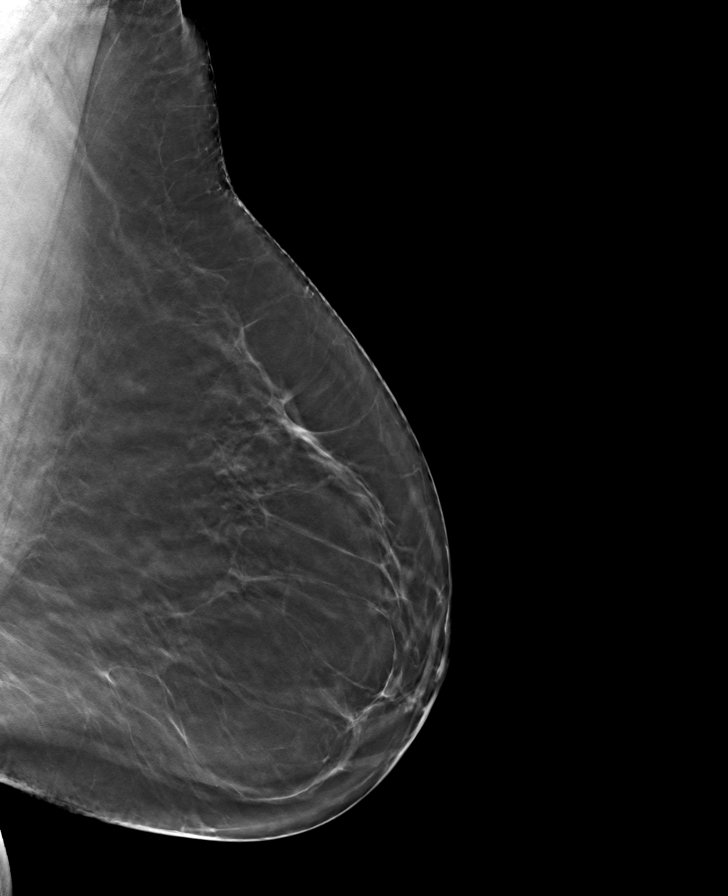

[L CC tomo · tomo slice 39/78.0]
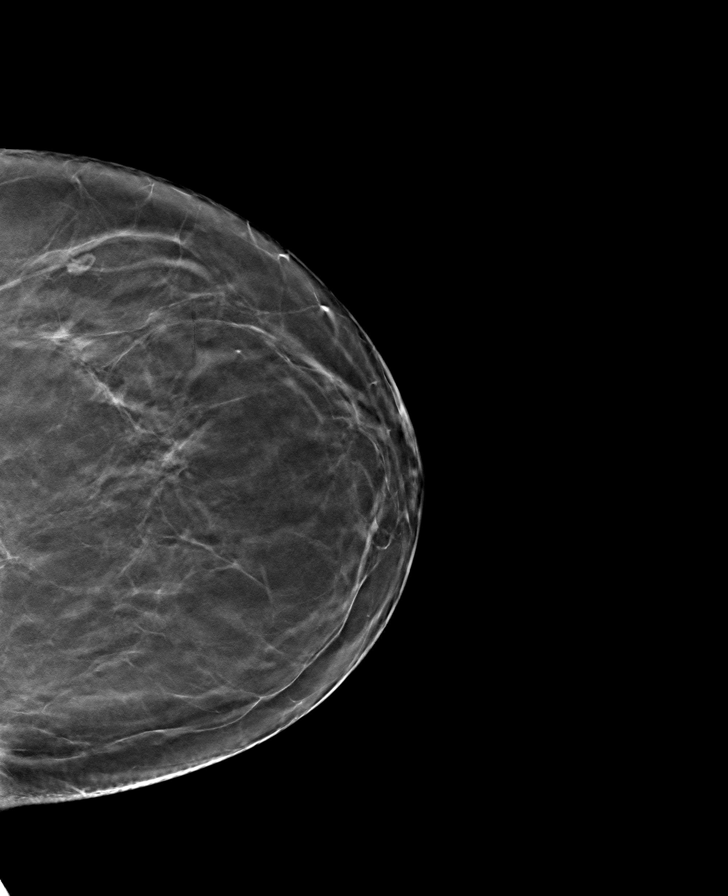

[R MLO tomo · tomo slice 43/84.0]
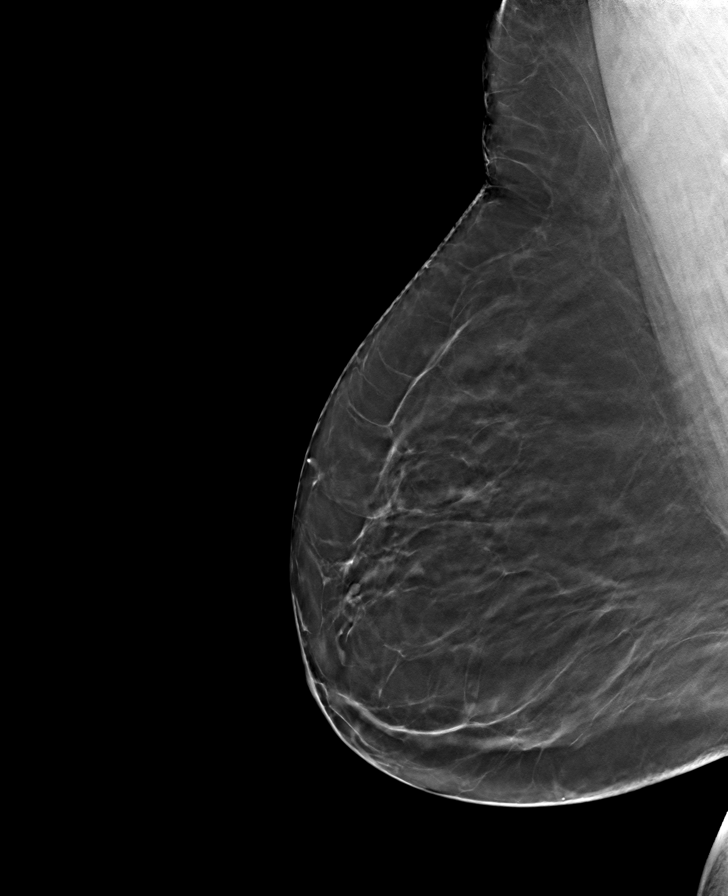

[R CC tomo · tomo slice 36/71.0]
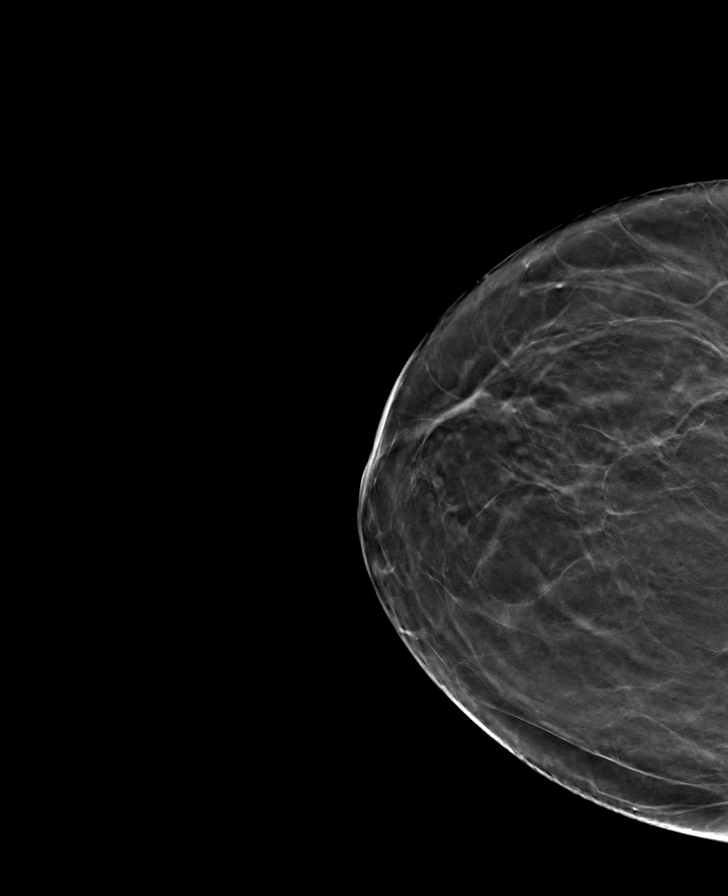

[8 of 24 positions shown; findings below may reference images not displayed]

ACR Breast Density Category b: There are scattered areas of
fibroglandular density.
FINDINGS: There are no findings suspicious for malignancy. Images were
processed with CAD.
IMPRESSION: No mammographic evidence of malignancy. A result letter of this
screening mammogram will be mailed directly to the patient.

RECOMMENDATION:
Screening mammogram in one year. (Code:[id number]-775)

BI-RADS CATEGORY  1: Negative.

## 2023-07-19 ENCOUNTER — Ambulatory Visit (INDEPENDENT_AMBULATORY_CARE_PROVIDER_SITE_OTHER): Admitting: Podiatry

## 2023-07-19 ENCOUNTER — Ambulatory Visit (INDEPENDENT_AMBULATORY_CARE_PROVIDER_SITE_OTHER)

## 2023-07-19 ENCOUNTER — Encounter: Payer: Self-pay | Admitting: Podiatry

## 2023-07-19 VITALS — Ht 65.0 in | Wt 184.0 lb

## 2023-07-19 DIAGNOSIS — M21611 Bunion of right foot: Secondary | ICD-10-CM

## 2023-07-19 DIAGNOSIS — M21612 Bunion of left foot: Secondary | ICD-10-CM

## 2023-07-19 NOTE — Progress Notes (Signed)
   Chief Complaint  Patient presents with   Bunions    RM6: New Patient, Pain in both feet ,Bunions    Subjective: 63 y.o. female presents today as a new patient for evaluation of a bunion to the right foot.  Intermittently symptomatic depending on shoe gear.  [place] she has no pain.  Only certain shoes aggravate her bunion  Past Medical History:  Diagnosis Date   Breast screening, unspecified    Hypertension 2010   Lump or mass in breast 2008,2009,2103   Personal history of tobacco use, presenting hazards to health 2013   Solitary cyst of breast 06/11/2012   left breast 8 o'clock-path, cystic lesionwith small adjacent nodular areas that could be consistent with fat necrosis   Special screening for malignant neoplasms, colon     Past Surgical History:  Procedure Laterality Date   ABDOMINAL HYSTERECTOMY  2004   BREAST BIOPSY Left    core- neg   BREAST SURGERY Right 2008   Intraductal papilloma excised   BREAST SURGERY Left 2009,2013   Benign breast tissue with dense fibrosis., Focal adenosis with a few small intraductal microcalcifications.   COLONOSCOPY  2010    Allergies  Allergen Reactions   Pollen Extract    Tree Extract      Objective: Physical Exam General: The patient is alert and oriented x3 in no acute distress.  Dermatology: Skin is cool, dry and supple bilateral lower extremities. Negative for open lesions or macerations.  Vascular: Palpable pedal pulses bilaterally. No edema or erythema noted. Capillary refill within normal limits.  Neurological: Grossly intact via light touch  Musculoskeletal Exam: Clinical evidence of bunion deformity noted to the respective foot.  Range of motion is WNL.  There is no tenderness with palpation and range of motion today  Radiographic Exam B/L feet 07/19/2023: Normal osseous mineralization.  No acute fractures identified.  Increased intermetatarsal angle noted  to the right foot with evidence of bunion deformity  Assessment: 1.  Hallux valgus RT   Plan of Care:  -Patient was evaluated. X-Rays reviewed. -Her feet are only symptomatic in wintertime with narrow close toed shoes.  Advised against this.  Recommend wide shoes that allow plenty of room in the toebox area -For now continue to pursue conservative treatment -Return to clinic as needed   Thresa EMERSON Sar, DPM [facility]  Dr. Thresa EMERSON Sar, DPM    [address] [place], [place] [address]                Office [phone]  Fax [phone]
